# Patient Record
Sex: Female | Born: 1965
Health system: Southern US, Community
[De-identification: ages and names within clinical notes are randomized; demographics above are authoritative.]

## PROBLEM LIST (undated history)

## (undated) DIAGNOSIS — K2270 Barrett's esophagus without dysplasia: Secondary | ICD-10-CM

## (undated) DIAGNOSIS — A048 Other specified bacterial intestinal infections: Secondary | ICD-10-CM

## (undated) DIAGNOSIS — I10 Essential (primary) hypertension: Secondary | ICD-10-CM

## (undated) DIAGNOSIS — K219 Gastro-esophageal reflux disease without esophagitis: Secondary | ICD-10-CM

## (undated) DIAGNOSIS — M542 Cervicalgia: Secondary | ICD-10-CM

## (undated) DIAGNOSIS — R011 Cardiac murmur, unspecified: Secondary | ICD-10-CM

## (undated) HISTORY — PX: ECTOPIC PREGNANCY SURGERY: SHX613

## (undated) HISTORY — PX: BREAST BIOPSY: SHX20

## (undated) HISTORY — DX: Cardiac murmur, unspecified: R01.1

## (undated) HISTORY — DX: Cervicalgia: M54.2

## (undated) HISTORY — PX: CHOLECYSTECTOMY: SHX55

## (undated) HISTORY — DX: Gastro-esophageal reflux disease without esophagitis: K21.9

## (undated) HISTORY — DX: Other specified bacterial intestinal infections: A04.8

---

## 2008-01-05 ENCOUNTER — Encounter: Payer: Self-pay | Admitting: Family Medicine

## 2013-05-18 LAB — HM COLONOSCOPY

## 2016-05-29 ENCOUNTER — Ambulatory Visit (INDEPENDENT_AMBULATORY_CARE_PROVIDER_SITE_OTHER): Payer: Self-pay | Admitting: Primary Care

## 2016-05-29 DIAGNOSIS — Z0289 Encounter for other administrative examinations: Secondary | ICD-10-CM

## 2016-06-12 ENCOUNTER — Encounter (HOSPITAL_COMMUNITY): Payer: Self-pay

## 2016-06-12 ENCOUNTER — Ambulatory Visit (HOSPITAL_COMMUNITY)
Admission: EM | Admit: 2016-06-12 | Discharge: 2016-06-12 | Disposition: A | Discharge status: Home / Self Care | Payer: Commercial Managed Care - PPO | Attending: Family Medicine | Admitting: Family Medicine

## 2016-06-12 DIAGNOSIS — R110 Nausea: Secondary | ICD-10-CM | POA: Diagnosis not present

## 2016-06-12 DIAGNOSIS — Z9049 Acquired absence of other specified parts of digestive tract: Secondary | ICD-10-CM | POA: Insufficient documentation

## 2016-06-12 DIAGNOSIS — Z888 Allergy status to other drugs, medicaments and biological substances status: Secondary | ICD-10-CM | POA: Diagnosis not present

## 2016-06-12 DIAGNOSIS — H10021 Other mucopurulent conjunctivitis, right eye: Secondary | ICD-10-CM | POA: Insufficient documentation

## 2016-06-12 DIAGNOSIS — F17210 Nicotine dependence, cigarettes, uncomplicated: Secondary | ICD-10-CM | POA: Diagnosis not present

## 2016-06-12 DIAGNOSIS — N3000 Acute cystitis without hematuria: Secondary | ICD-10-CM | POA: Insufficient documentation

## 2016-06-12 DIAGNOSIS — I10 Essential (primary) hypertension: Secondary | ICD-10-CM | POA: Diagnosis not present

## 2016-06-12 DIAGNOSIS — Z79899 Other long term (current) drug therapy: Secondary | ICD-10-CM | POA: Diagnosis not present

## 2016-06-12 HISTORY — DX: Essential (primary) hypertension: I10

## 2016-06-12 HISTORY — DX: Barrett's esophagus without dysplasia: K22.70

## 2016-06-12 LAB — POCT URINALYSIS DIP (DEVICE)
BILIRUBIN URINE: NEGATIVE
Glucose, UA: NEGATIVE mg/dL
KETONES UR: NEGATIVE mg/dL
NITRITE: POSITIVE — AB
Protein, ur: 100 mg/dL — AB
SPECIFIC GRAVITY, URINE: 1.025 (ref 1.005–1.030)
Urobilinogen, UA: 0.2 mg/dL (ref 0.0–1.0)
pH: 5.5 (ref 5.0–8.0)

## 2016-06-12 MED ORDER — TOBRAMYCIN 0.3 % OP SOLN: 1.0000 [drp] | OPHTHALMIC | 0 refills | Status: DC

## 2016-06-12 MED ORDER — CEPHALEXIN 500 MG PO CAPS: 500.0000 mg | ORAL_CAPSULE | Freq: Three times a day (TID) | ORAL | 0 refills | Status: DC

## 2016-06-12 NOTE — ED Triage Notes (Signed)
Pt here for pink eye in the right eye which has going on for 2 days. Using visine. UTI for 3 weeks was called in a rx from her pcp out of town. Finished it and 2 days later it came back. Having pressure in her vagina and cramping. No fever.

## 2016-06-12 NOTE — ED Provider Notes (Signed)
[facility]    CSN: [id number] Arrival date & time: 06/12/16  1237     History   Chief Complaint Chief Complaint  Patient presents with  . Conjunctivitis  . Urinary Tract Infection    HPI Christina Bell is a 51 y.o. female.   This is a 51 year old woman who just moved down to [place] from [place]. Her husband is working at [employer]. Her graft patient had 2 days of right eye injection with purulent discharge and itchiness. Eyes turned red as well.  She's also has dysuria and frequency. She was treated for a UTI 2 weeks ago by her doctor in [place] and his symptoms seemed to resolve. She remembers the antibiotic by the name of Cinobac. Her symptoms began returning several days ago, 2 days after she finished the antibiotic. She's had some mild nausea but no vomiting. She has no flank pain or fever.      Past Medical History:  Diagnosis Date  . Barrett esophagus   . Hypertension     There are no active problems to display for this patient.   Past Surgical History:  Procedure Laterality Date  . CHOLECYSTECTOMY      OB History    No data available       Home Medications    Prior to Admission medications   Medication Sig Start Date End Date Taking? Authorizing Provider  amLODipine (NORVASC) 10 MG tablet Take 10 mg by mouth daily.   Yes Historical Provider, MD  metoprolol succinate (TOPROL-XL) 50 MG 24 hr tablet Take 50 mg by mouth daily. Take with or immediately following a meal.   Yes Historical Provider, MD  omeprazole (PRILOSEC) 20 MG capsule Take 20 mg by mouth daily.   Yes Historical Provider, MD  cephALEXin (KEFLEX) 500 MG capsule Take 1 capsule (500 mg total) by mouth 3 (three) times daily. 06/12/16   Elvina SidleKurt Tahjanae Blankenburg, MD  tobramycin (TOBREX) 0.3 % ophthalmic solution Place 1 drop into the right eye every 4 (four) hours. 06/12/16   Elvina SidleKurt Maicee Ullman, MD    Family History No family history on file.  Social History Social History  Substance  Use Topics  . Smoking status: Current Every Day Smoker    Packs/day: 0.50    Years: 25.00    Types: Cigarettes  . Smokeless tobacco: Never Used  . Alcohol use 1.2 oz/week    2 Standard drinks or equivalent per week     Allergies   Morphine and related   Review of Systems Review of Systems  Constitutional: Negative.   HENT: Negative.   Eyes: Positive for discharge, redness and itching.  Genitourinary: Positive for dysuria and frequency. Negative for flank pain.     Physical Exam Triage Vital Signs ED Triage Vitals [06/12/16 1330]  Enc Vitals Group     BP (!) 159/101     Pulse Rate 119     Resp 20     Temp 98.8 F (37.1 C)     Temp src      SpO2 97 %     Weight      Height      Head Circumference      Peak Flow      Pain Score      Pain Loc      Pain Edu?      Excl. in GC?    No data found.   Updated Vital Signs BP (!) 159/101 (BP Location: Left Arm)   Pulse 119  Temp 98.8 F (37.1 C)   Resp 20   SpO2 97%    Physical Exam  Constitutional: She is oriented to person, place, and time. She appears well-developed and well-nourished.  HENT:  Head: Normocephalic.  Right Ear: External ear normal.  Left Ear: External ear normal.  Mouth/Throat: Oropharynx is clear and moist.  Eyes: Right eye exhibits discharge.  Injected conjunctiva, right  Normal funduscopic exam  Neck: Normal range of motion. Neck supple.  Pulmonary/Chest: Effort normal.  Musculoskeletal: Normal range of motion.  Neurological: She is alert and oriented to person, place, and time.  Skin: Skin is warm and dry.  Nursing note and vitals reviewed.    UC Treatments / Results  Labs (all labs ordered are listed, but only abnormal results are displayed) Labs Reviewed  POCT URINALYSIS DIP (DEVICE) - Abnormal; Notable for the following:       Result Value   Hgb urine dipstick MODERATE (*)    Protein, ur 100 (*)    Nitrite POSITIVE (*)    Leukocytes, UA MODERATE (*)    All other  components within normal limits  URINE CULTURE    EKG  EKG Interpretation None       Radiology No results found.  Procedures Procedures (including critical care time)  Medications Ordered in UC Medications - No data to display   Initial Impression / Assessment and Plan / UC Course  I have reviewed the triage vital signs and the nursing notes.  Pertinent labs & imaging results that were available during my care of the patient were reviewed by me and considered in my medical decision making (see chart for details).     Final Clinical Impressions(s) / UC Diagnoses   Final diagnoses:  Acute cystitis without hematuria  Other mucopurulent conjunctivitis of right eye    New Prescriptions New Prescriptions   CEPHALEXIN (KEFLEX) 500 MG CAPSULE    Take 1 capsule (500 mg total) by mouth 3 (three) times daily.   TOBRAMYCIN (TOBREX) 0.3 % OPHTHALMIC SOLUTION    Place 1 drop into the right eye every 4 (four) hours.     Elvina Sidle, MD 06/12/16 1409

## 2016-06-14 LAB — URINE CULTURE: Culture: >=100000 — AB

## 2016-06-30 ENCOUNTER — Ambulatory Visit (INDEPENDENT_AMBULATORY_CARE_PROVIDER_SITE_OTHER): Payer: Commercial Managed Care - PPO | Admitting: Primary Care

## 2016-06-30 ENCOUNTER — Encounter: Payer: Self-pay | Admitting: Primary Care

## 2016-06-30 VITALS — BP 118/86 | HR 89 | Temp 98.1°F | Ht 62.5 in | Wt 167.0 lb

## 2016-06-30 DIAGNOSIS — I10 Essential (primary) hypertension: Secondary | ICD-10-CM | POA: Diagnosis not present

## 2016-06-30 DIAGNOSIS — K219 Gastro-esophageal reflux disease without esophagitis: Secondary | ICD-10-CM | POA: Diagnosis not present

## 2016-06-30 DIAGNOSIS — R300 Dysuria: Secondary | ICD-10-CM | POA: Diagnosis not present

## 2016-06-30 LAB — POC URINALSYSI DIPSTICK (AUTOMATED)
BILIRUBIN UA: NEGATIVE
GLUCOSE UA: NEGATIVE
Nitrite, UA: NEGATIVE
PH UA: 6
Protein, UA: 15
RBC UA: NEGATIVE
Urobilinogen, UA: 0.2

## 2016-06-30 MED ORDER — SULFAMETHOXAZOLE-TRIMETHOPRIM 800-160 MG PO TABS: 1.0000 | ORAL_TABLET | Freq: Two times a day (BID) | ORAL | 0 refills | Status: DC

## 2016-06-30 MED ORDER — AMLODIPINE BESYLATE 10 MG PO TABS: 10.0000 mg | ORAL_TABLET | Freq: Every day | ORAL | 3 refills | Status: DC

## 2016-06-30 MED ORDER — OMEPRAZOLE 20 MG PO CPDR: 20.0000 mg | DELAYED_RELEASE_CAPSULE | Freq: Every day | ORAL | 3 refills | Status: DC

## 2016-06-30 MED ORDER — CIPROFLOXACIN HCL 500 MG PO TABS: 500.0000 mg | ORAL_TABLET | Freq: Two times a day (BID) | ORAL | 0 refills | Status: DC

## 2016-06-30 MED ORDER — METOPROLOL SUCCINATE ER 50 MG PO TB24: 50.0000 mg | ORAL_TABLET | Freq: Every day | ORAL | 3 refills | Status: DC

## 2016-06-30 NOTE — Assessment & Plan Note (Signed)
Stable on current regimen. Refills sent to pharmacy. Will obtain records for recent BMP.

## 2016-06-30 NOTE — Progress Notes (Signed)
Pre visit review using our clinic review tool, if applicable. No additional management support is needed unless otherwise documented below in the visit note. 

## 2016-06-30 NOTE — Assessment & Plan Note (Signed)
History of Barrett's esophagus, stable on omeprazole 20 mg. Refills sent to pharmacy.

## 2016-06-30 NOTE — Progress Notes (Signed)
Subjective:    Patient ID: Christina Bell, female    DOB: 1965/08/14, 51 y.o.   MRN: [id number]  HPI  Christina Bell is a 51 year old female who presents today to establish care and discuss the problems mentioned below. Will obtain old records.  1) Essential Hypertension: Diagnosed several years ago. Currently managed on amlodipine 10 mg and metoprolol succinate 50 mg. She does not check her BP at home. She denies visual changes, dizziness, headaches, chest pain.  2) UTI: No prior history of UTI's. Evaluated and treated at [facility] on 06/12/16 with complaints of dysuria and frequency. She was treated for UTI two weeks prior by PCP in [place]. She was not seen by PCP, antibiotics were sent in without exam. UA on 06/12/16 with leuks, nitrites, blood. She was prescribed cephalexin and sent home. Her urine culture came back positive for E.Coli which was sensitive to cephalexin.   Since treatment she's noticed frequency with dribbling, pelvic cramping, and left low back pain. Overall her cramping has improved. She completed her cephalexin as prescribed. She denies hematuria, fevers, vaginal discharge, vaginal itching.   3) Depression: She has symptoms of no energy, feeling tired, feeling stressed, is grieving the loss of her father, no motivation to do anything. She's been feeling this way for the past 2 months. PHQ 9 score of 12 and GAD 7 score of 8 today. She's recently moved from [place]. Denies SI/HI.  Review of Systems  Constitutional: Positive for fatigue.  Eyes: Negative for visual disturbance.  Respiratory: Negative for shortness of breath.   Cardiovascular: Negative for chest pain.  Genitourinary: Positive for dysuria and frequency.  Musculoskeletal: Positive for back pain.  Neurological: Negative for headaches.  Psychiatric/Behavioral: Positive for sleep disturbance. The patient is nervous/anxious.        Depression symptoms       Past Medical History:  Diagnosis Date  . Barrett  esophagus   . GERD (gastroesophageal reflux disease)   . Heart murmur   . Hypertension      Social History   Social History  . Marital status: Married    Spouse name: N/A  . Number of children: N/A  . Years of education: N/A   Occupational History  . Not on file.   Social History Main Topics  . Smoking status: Current Every Day Smoker    Packs/day: 0.50    Years: 25.00    Types: Cigarettes  . Smokeless tobacco: Never Used  . Alcohol use 1.2 oz/week    2 Standard drinks or equivalent per week  . Drug use: Unknown  . Sexual activity: Not on file   Other Topics Concern  . Not on file   Social History Narrative   Married.   3 children.   Worked as a Runner, broadcasting/film/video.    Enjoys waking, bicycling, driving.    Past Surgical History:  Procedure Laterality Date  . CESAREAN SECTION    . CHOLECYSTECTOMY    . ECTOPIC PREGNANCY SURGERY      Family History  Problem Relation Age of Onset  . Hypertension Mother   . Lung cancer Maternal Grandmother     Allergies  Allergen Reactions  . Morphine And Related Hives    No current outpatient prescriptions on file prior to visit.   No current facility-administered medications on file prior to visit.     BP 118/86 (BP Location: Left Arm, Patient Position: Sitting, Cuff Size: Normal)   Pulse 89   Temp 98.1 F (36.7 C) (Oral)  Ht 5' 2.5" (1.588 m)   Wt 167 lb (75.8 kg)   SpO2 97%   BMI 30.06 kg/m    Objective:   Physical Exam  Constitutional: She appears well-nourished.  Neck: Neck supple.  Cardiovascular: Normal rate and regular rhythm.   Pulmonary/Chest: Effort normal and breath sounds normal.  Abdominal: Soft. Bowel sounds are normal. There is no tenderness. There is no CVA tenderness.  Skin: Skin is warm and dry.  Psychiatric: She has a normal mood and affect.          Assessment & Plan:  Dysuria/Urgency:  Diagnosed with UTI on 06/12/16, treated with Cephalexin. UA today: 3+ leuks, no nitrites, no  blood. Culture sent. Given symptoms, UA result, and presentation, will treat. Rx for Bactrim course sent to pharmacy. Discussed to push intake of water.  Follow up PRN.  Morrie Sheldonlark,Asencion Loveday Kendal, NP

## 2016-06-30 NOTE — Addendum Note (Signed)
Addended by: Eual FinesBRIDGES, SHANNON P on: 06/30/2016 02:50 PM   Modules accepted: Orders

## 2016-06-30 NOTE — Patient Instructions (Addendum)
I sent refills of your medications to the pharmacy.  Depression/Anxiety: Start exercising. You should be getting 150 minutes of moderate intensity exercise weekly.  Eat a balanced diet with fresh fruits, vegetables, whole grains.  Ensure you are consuming 64 ounces of water daily.  Please message me through My Chart if you're interested in seeing our therapist.   Urinary Tract Infection: Start Bactrim antibiotics. Take 1 tablet by mouth twice daily for 7 days.  Please schedule a physical with me in 2018. You may also schedule a lab only appointment 3-4 days prior. We will discuss your lab results in detail during your physical.  It was a pleasure to meet you today! Please don't hesitate to call me with any questions. Welcome to [facility]!

## 2016-07-02 LAB — URINE CULTURE

## 2016-08-21 ENCOUNTER — Telehealth: Payer: Self-pay | Admitting: Primary Care

## 2016-08-21 NOTE — Telephone Encounter (Signed)
Left message for pt to call re: 05/29/16 No show fee.  Pt reschedueld on 06/19/16, but cannot see where called to cancel.  Pt to call back  / lt

## 2016-08-28 ENCOUNTER — Telehealth: Payer: Self-pay | Admitting: Primary Care

## 2016-08-28 ENCOUNTER — Encounter: Payer: Self-pay | Admitting: Primary Care

## 2016-08-28 ENCOUNTER — Encounter (INDEPENDENT_AMBULATORY_CARE_PROVIDER_SITE_OTHER): Payer: Self-pay

## 2016-08-28 ENCOUNTER — Ambulatory Visit (INDEPENDENT_AMBULATORY_CARE_PROVIDER_SITE_OTHER): Payer: Commercial Managed Care - PPO | Admitting: Primary Care

## 2016-08-28 VITALS — BP 136/84 | HR 86 | Temp 98.0°F | Ht 62.5 in | Wt 170.8 lb

## 2016-08-28 DIAGNOSIS — H1011 Acute atopic conjunctivitis, right eye: Secondary | ICD-10-CM | POA: Diagnosis not present

## 2016-08-28 DIAGNOSIS — K219 Gastro-esophageal reflux disease without esophagitis: Secondary | ICD-10-CM | POA: Diagnosis not present

## 2016-08-28 DIAGNOSIS — H1010 Acute atopic conjunctivitis, unspecified eye: Secondary | ICD-10-CM | POA: Insufficient documentation

## 2016-08-28 DIAGNOSIS — N390 Urinary tract infection, site not specified: Secondary | ICD-10-CM

## 2016-08-28 DIAGNOSIS — K030 Excessive attrition of teeth: Secondary | ICD-10-CM

## 2016-08-28 DIAGNOSIS — K032 Erosion of teeth: Secondary | ICD-10-CM

## 2016-08-28 LAB — POC URINALSYSI DIPSTICK (AUTOMATED)
Bilirubin, UA: NEGATIVE
Glucose, UA: NEGATIVE
Ketones, UA: NEGATIVE
Leukocytes, UA: NEGATIVE
NITRITE UA: NEGATIVE
PROTEIN UA: NEGATIVE
RBC UA: NEGATIVE
UROBILINOGEN UA: NEGATIVE U/dL — AB
pH, UA: 6 (ref 5.0–8.0)

## 2016-08-28 MED ORDER — OLOPATADINE HCL 0.1 % OP SOLN: 1.0000 [drp] | Freq: Two times a day (BID) | OPHTHALMIC | 1 refills | Status: DC

## 2016-08-28 NOTE — Assessment & Plan Note (Signed)
Chronic since early January 2018, no improvement with antibiotic drops or OTC drops. Both eyes appear injected, mostly right.  Rx for Patanol gtts sent to pharmacy.  She will update. Consider OTC antihistamine.

## 2016-08-28 NOTE — Assessment & Plan Note (Signed)
Three infections since early January 2018. UA today with trace protein otherwise unremarkable. Bed wetting is suspicious for Uro/GYN consult, also recurrent UTI's. Culture sent today. Referral placed for Urology evaluation.

## 2016-08-28 NOTE — Progress Notes (Signed)
Subjective:    Patient ID: Christina Bell, female    DOB: [birth year], 51 y.o.   MRN: [id number]  HPI  Christina Bell is a 51 year old female who presents today with multiple complaints.  1) Urinary Frequency: Intermittent since January 2018. She was treated remotely for UTI by her prior PCP in [place] in early January with antibiotics. She then presented to urgent care in late January 2018 with complaints of return in symptoms just after completing the antibiotics. During her visit in the Urgent Care she was treated with Keflex course. Positive culture that was sensitive to Keflex.  She then presented to our office in mid February with complaints of UTI symptoms. Her UA was noted to have 3+ leuks, no nitirites, no blood. She was treated with Bactrim DS tablets. Her culture returned positive for E. Coli which was sensitive to Bactrim.   Her frequency has been present this time intermittently for the past three weeks. She also reports pelvic pain. She denies vaginal discharge, vaginal itching, vaginal bleeding. She's also noticed urinating in her bed for the past three nights. This did wake her from sleep. She had a history of uterine ablation.   2) Eye Irritation: Located to the right eye that has been intermittent since January 2018. She originally presented to urgent care in late January 2018 with complaints of purulent discharge and itchiness. During her visit in the Urgent Care she was treated with Tobramycin drops.    She never felt better after treatment with tobramycin. She's experiencing symptoms of itchy/watery eyes, with clear drainage every morning. Her symptoms are daily. She's been using Vysine drops and Tobramycin without improvement.    3) Dental Implants: History of Barrett's Esophagus and GERD for years. Recently underwent dental surgery which included complete dental implants due to erosion to the enamel on most of her teeth. She's having trouble getting dental insurance coverage  for this massive procedure. She is needing a referral to her dentist as this was caused by chronic GERD secondary to Barrett's esophagus.   Review of Systems  Constitutional: Negative for fever.  HENT: Positive for rhinorrhea. Negative for congestion.   Eyes: Positive for discharge, redness and itching.  Respiratory: Negative for cough.   Genitourinary: Positive for frequency and urgency. Negative for dysuria, flank pain, hematuria and vaginal discharge.       Pelvic pressure       Past Medical History:  Diagnosis Date  . Barrett esophagus   . GERD (gastroesophageal reflux disease)   . Heart murmur   . Hypertension      Social History   Social History  . Marital status: Married    Spouse name: N/A  . Number of children: N/A  . Years of education: N/A   Occupational History  . Not on file.   Social History Main Topics  . Smoking status: Current Every Day Smoker    Packs/day: 0.50    Years: 25.00    Types: Cigarettes  . Smokeless tobacco: Never Used  . Alcohol use 1.2 oz/week    2 Standard drinks or equivalent per week  . Drug use: Unknown  . Sexual activity: Not on file   Other Topics Concern  . Not on file   Social History Narrative   Married.   3 children.   Worked as a Runner, broadcasting/film/video.    Enjoys waking, bicycling, driving.    Past Surgical History:  Procedure Laterality Date  . CESAREAN SECTION    . CHOLECYSTECTOMY    .  ECTOPIC PREGNANCY SURGERY      Family History  Problem Relation Age of Onset  . Hypertension Mother   . Lung cancer Maternal Grandmother     Allergies  Allergen Reactions  . Morphine And Related Hives    Current Outpatient Prescriptions on File Prior to Visit  Medication Sig Dispense Refill  . amLODipine (NORVASC) 10 MG tablet Take 1 tablet (10 mg total) by mouth daily. 90 tablet 3  . metoprolol succinate (TOPROL-XL) 50 MG 24 hr tablet Take 1 tablet (50 mg total) by mouth daily. Take with or immediately following a meal. 90 tablet 3     No current facility-administered medications on file prior to visit.     BP 136/84   Pulse 86   Temp 98 F (36.7 C) (Oral)   Ht 5' 2.5" (1.588 m)   Wt 170 lb 12.8 oz (77.5 kg)   SpO2 98%   BMI 30.74 kg/m    Objective:   Physical Exam  Constitutional: She appears well-nourished.  Eyes: Right eye exhibits no discharge. No foreign body present in the right eye. Left eye exhibits no discharge. No foreign body present in the left eye. Right conjunctiva is injected. Right conjunctiva has no hemorrhage. Left conjunctiva is injected. Left conjunctiva has no hemorrhage.  Neck: Neck supple.  Cardiovascular: Normal rate and regular rhythm.   Pulmonary/Chest: Effort normal and breath sounds normal.  Abdominal: Soft. Normal appearance and bowel sounds are normal. There is no tenderness.  Skin: Skin is warm and dry.          Assessment & Plan:

## 2016-08-28 NOTE — Telephone Encounter (Signed)
Placed letter for Jae Dire to review in her inbox.

## 2016-08-28 NOTE — Patient Instructions (Addendum)
Start olopatadine drops for allergy eye irritation. Instill 1 drop into both eyes twice daily x 1 week or until symptoms resolve.  Please notify me if no improvement.  Stop by the front desk and speak with either Shirlee Limerick or Zella Ball regarding your referral to Urology.  Please provide me with a copy of the letter from your dentist.  It was a pleasure to see you today!

## 2016-08-28 NOTE — Assessment & Plan Note (Signed)
Will look for letter from Dentist who completed implants. Do believe erosion of enamel secondary to GERD from Barrett's Esophagus. Will place referral for insurance purposes once letter read from dentist.

## 2016-08-28 NOTE — Telephone Encounter (Signed)
Paper was dropped off regarding patient. Left in RX tower.

## 2016-08-28 NOTE — Progress Notes (Signed)
Pre visit review using our clinic review tool, if applicable. No additional management support is needed unless otherwise documented below in the visit note. 

## 2016-08-28 NOTE — Telephone Encounter (Signed)
Noted, letter received, referral placed. Agree that the dental erosion and severe attrition were secondary to GERD from Barrett's esophagus. Letter scanned into patient's chart.

## 2016-08-30 LAB — URINE CULTURE

## 2016-09-03 ENCOUNTER — Encounter: Payer: Self-pay | Admitting: Primary Care

## 2016-10-18 ENCOUNTER — Other Ambulatory Visit: Payer: Self-pay | Admitting: Primary Care

## 2016-10-18 DIAGNOSIS — H1011 Acute atopic conjunctivitis, right eye: Secondary | ICD-10-CM

## 2016-10-19 NOTE — Telephone Encounter (Signed)
Ok to refill? Electronically refill request for olopatadine (PATANOL) 0.1 % ophthalmic solution.  Last prescribed and seen on 08/28/2016

## 2016-10-19 NOTE — Telephone Encounter (Signed)
Are the olopatadine eye drops helping with her eye symptoms? Does she need a refill or was this an automatic request?

## 2016-10-20 NOTE — Telephone Encounter (Signed)
Spoke to pt. She said she is taking Claritin and it seems to be helping better than the drops. She did not ask for a refill.

## 2016-10-20 NOTE — Telephone Encounter (Signed)
Left detailed message on vm per dpr to cal the office to let us know if she really needed the refill.

## 2016-11-30 ENCOUNTER — Telehealth: Payer: Self-pay | Admitting: Primary Care

## 2016-11-30 NOTE — Telephone Encounter (Signed)
Pt has cpe next Monday, she is scheduled for labs this Thursday 12/03/16.  No labs are in yet, can you please add labs? Thanks

## 2016-12-01 ENCOUNTER — Other Ambulatory Visit: Payer: Self-pay | Admitting: Primary Care

## 2016-12-01 DIAGNOSIS — I10 Essential (primary) hypertension: Secondary | ICD-10-CM

## 2016-12-03 ENCOUNTER — Other Ambulatory Visit (INDEPENDENT_AMBULATORY_CARE_PROVIDER_SITE_OTHER): Payer: Commercial Managed Care - PPO

## 2016-12-03 DIAGNOSIS — I10 Essential (primary) hypertension: Secondary | ICD-10-CM

## 2016-12-03 LAB — LIPID PANEL
CHOLESTEROL: 234 mg/dL — AB (ref 0–200)
HDL: 89.9 mg/dL (ref >39.00)
LDL CALC: 127 mg/dL — AB (ref 0–99)
NONHDL: 144.29
Total CHOL/HDL Ratio: 3
Triglycerides: 88 mg/dL (ref 0.0–149.0)
VLDL: 17.6 mg/dL (ref 0.0–40.0)

## 2016-12-03 LAB — COMPREHENSIVE METABOLIC PANEL
ALBUMIN: 4.4 g/dL (ref 3.5–5.2)
ALK PHOS: 81 U/L (ref 39–117)
ALT: 42 U/L — ABNORMAL HIGH (ref 0–35)
AST: 60 U/L — AB (ref 0–37)
BUN: 10 mg/dL (ref 6–23)
CO2: 26 mEq/L (ref 19–32)
CREATININE: 0.67 mg/dL (ref 0.40–1.20)
Calcium: 10 mg/dL (ref 8.4–10.5)
Chloride: 104 mEq/L (ref 96–112)
GFR: 98.17 mL/min (ref >60.00)
GLUCOSE: 90 mg/dL (ref 70–99)
POTASSIUM: 4 meq/L (ref 3.5–5.1)
SODIUM: 142 meq/L (ref 135–145)
TOTAL PROTEIN: 7.3 g/dL (ref 6.0–8.3)
Total Bilirubin: 0.7 mg/dL (ref 0.2–1.2)

## 2016-12-07 ENCOUNTER — Ambulatory Visit (INDEPENDENT_AMBULATORY_CARE_PROVIDER_SITE_OTHER): Payer: Commercial Managed Care - PPO | Admitting: Primary Care

## 2016-12-07 ENCOUNTER — Encounter: Payer: Self-pay | Admitting: Primary Care

## 2016-12-07 ENCOUNTER — Other Ambulatory Visit (HOSPITAL_COMMUNITY)
Admission: RE | Admit: 2016-12-07 | Discharge: 2016-12-07 | Disposition: A | Discharge status: Home / Self Care | Payer: Commercial Managed Care - PPO | Source: Ambulatory Visit | Attending: Primary Care | Admitting: Primary Care

## 2016-12-07 VITALS — BP 130/84 | HR 87 | Temp 98.1°F | Ht 62.5 in | Wt 153.0 lb

## 2016-12-07 DIAGNOSIS — E785 Hyperlipidemia, unspecified: Secondary | ICD-10-CM | POA: Insufficient documentation

## 2016-12-07 DIAGNOSIS — R7989 Other specified abnormal findings of blood chemistry: Secondary | ICD-10-CM | POA: Diagnosis present

## 2016-12-07 DIAGNOSIS — I10 Essential (primary) hypertension: Secondary | ICD-10-CM

## 2016-12-07 DIAGNOSIS — Z124 Encounter for screening for malignant neoplasm of cervix: Secondary | ICD-10-CM

## 2016-12-07 DIAGNOSIS — K219 Gastro-esophageal reflux disease without esophagitis: Secondary | ICD-10-CM | POA: Diagnosis not present

## 2016-12-07 DIAGNOSIS — Z0000 Encounter for general adult medical examination without abnormal findings: Secondary | ICD-10-CM

## 2016-12-07 DIAGNOSIS — Z1239 Encounter for other screening for malignant neoplasm of breast: Secondary | ICD-10-CM

## 2016-12-07 DIAGNOSIS — Z1231 Encounter for screening mammogram for malignant neoplasm of breast: Secondary | ICD-10-CM

## 2016-12-07 DIAGNOSIS — R945 Abnormal results of liver function studies: Secondary | ICD-10-CM

## 2016-12-07 MED ORDER — OMEPRAZOLE 40 MG PO CPDR: 40.0000 mg | DELAYED_RELEASE_CAPSULE | Freq: Every day | ORAL | 3 refills | Status: DC

## 2016-12-07 NOTE — Assessment & Plan Note (Signed)
TC slightly above goal, LDL borderline. Discussed to start exercising and increase vegetables, fruit, whole grains. Recommended to stop smoking. Will repeat lipids in 3 months.

## 2016-12-07 NOTE — Patient Instructions (Addendum)
Call the [facility] to schedule your mammogram.  We will notify you of your Pap results once received.   Reduce fatty/fried foods to help reduce cholesterol. increase vegetables, fruit, whole grains, water.  Ensure you are consuming 64 ounces of water daily.  Start exercising. You should be getting 150 minutes of moderate intensity exercise weekly.  Schedule a lab only appointment in 3 months to recheck your liver enzymes and cholesterol.  Follow up in 1 year for your annual exam or sooner if needed.  It was a pleasure to see you today!   High Cholesterol High cholesterol is a condition in which the blood has high levels of a white, waxy, fat-like substance (cholesterol). The human body needs small amounts of cholesterol. The liver makes all the cholesterol that the body needs. Extra (excess) cholesterol comes from the food that we eat. Cholesterol is carried from the liver by the blood through the blood vessels. If you have high cholesterol, deposits (plaques) may build up on the walls of your blood vessels (arteries). Plaques make the arteries narrower and stiffer. Cholesterol plaques increase your risk for heart attack and stroke. Work with your health care provider to keep your cholesterol levels in a healthy range. What increases the risk? This condition is more likely to develop in people who:  Eat foods that are high in animal fat (saturated fat) or cholesterol.  Are overweight.  Are not getting enough exercise.  Have a family history of high cholesterol.  What are the signs or symptoms? There are no symptoms of this condition. How is this diagnosed? This condition may be diagnosed from the results of a blood test.  If you are older than age 66, your health care provider may check your cholesterol every 4-6 years.  You may be checked more often if you already have high cholesterol or other risk factors for heart disease.  The blood test for cholesterol  measures:  "Bad" cholesterol (LDL cholesterol). This is the main type of cholesterol that causes heart disease. The desired level for LDL is less than 100.  "Good" cholesterol (HDL cholesterol). This type helps to protect against heart disease by cleaning the arteries and carrying the LDL away. The desired level for HDL is 60 or higher.  Triglycerides. These are fats that the body can store or burn for energy. The desired number for triglycerides is lower than 150.  Total cholesterol. This is a measure of the total amount of cholesterol in your blood, including LDL cholesterol, HDL cholesterol, and triglycerides. A healthy number is less than 200.  How is this treated? This condition is treated with diet changes, lifestyle changes, and medicines. Diet changes  This may include eating more whole grains, fruits, vegetables, nuts, and fish.  This may also include cutting back on red meat and foods that have a lot of added sugar. Lifestyle changes  Changes may include getting at least 40 minutes of aerobic exercise 3 times a week. Aerobic exercises include walking, biking, and swimming. Aerobic exercise along with a healthy diet can help you maintain a healthy weight.  Changes may also include quitting smoking. Medicines  Medicines are usually given if diet and lifestyle changes have failed to reduce your cholesterol to healthy levels.  Your health care provider may prescribe a statin medicine. Statin medicines have been shown to reduce cholesterol, which can reduce the risk of heart disease. Follow these instructions at home: Eating and drinking  If told by your health care provider:  Eat chicken (without skin), fish, veal, shellfish, ground Malaw[place]urkey breast, and round or loin cuts of red meat.  Do not eat fried foods or fatty meats, such as hot dogs and salami.  Eat plenty of fruits, such as apples.  Eat plenty of vegetables, such as broccoli, potatoes, and carrots.  Eat beans,  peas, and lentils.  Eat grains such as barley, rice, couscous, and bulgur wheat.  Eat pasta without cream sauces.  Use skim or nonfat milk, and eat low-fat or nonfat yogurt and cheeses.  Do not eat or drink whole milk, cream, ice cream, egg yolks, or hard cheeses.  Do not eat stick margarine or tub margarines that contain trans fats (also called partially hydrogenated oils).  Do not eat saturated tropical oils, such as coconut oil and palm oil.  Do not eat cakes, cookies, crackers, or other baked goods that contain trans fats.  General instructions  Exercise as directed by your health care provider. Increase your activity level with activities such as gardening, walking, and taking the stairs.  Take over-the-counter and prescription medicines only as told by your health care provider.  Do not use any products that contain nicotine or tobacco, such as cigarettes and e-cigarettes. If you need help quitting, ask your health care provider.  Keep all follow-up visits as told by your health care provider. This is important. Contact a health care provider if:  You are struggling to maintain a healthy diet or weight.  You need help to start on an exercise program.  You need help to stop smoking. Get help right away if:  You have chest pain.  You have trouble breathing. This information is not intended to replace advice given to you by your health care provider. Make sure you discuss any questions you have with your health care provider. Document Released: 05/04/2005 Document Revised: 11/30/2015 Document Reviewed: 11/02/2015 Elsevier Interactive Patient Education  2017 [employer]

## 2016-12-07 NOTE — Assessment & Plan Note (Signed)
Stable in the office today, continue amlodipine 10 mg and metoprolol succinate 50 mg. BMP unremarkable.

## 2016-12-07 NOTE — Assessment & Plan Note (Signed)
Well controlled on omeprazole 40 mg. Recently completed dental work due to reflux erosion to her oral cavity.

## 2016-12-07 NOTE — Assessment & Plan Note (Signed)
Slightly above goal today. Recommended she reduce alcohol consumption, fatty foods, start exercising. Recheck in 3 months.

## 2016-12-07 NOTE — Assessment & Plan Note (Signed)
TD UTD per patient, she will check records. Pap due, completed and pending. Mammogram due, ordered. Recommended regular exercise and improvement in diet. Exam unremarkable. Labs with hyperlipidemia and slight elevation in LFT's.  Follow up in 1 year.

## 2016-12-07 NOTE — Addendum Note (Signed)
Addended by: Tawnya CrookSAMBATH, Hideko Esselman on: 12/07/2016 03:26 PM   Modules accepted: Orders

## 2016-12-07 NOTE — Progress Notes (Signed)
Subjective:    Patient ID: Christina Bell, female    DOB: [birth year], 51 y.o.   MRN: [id number]  HPI  Christina Bell is a 51 year old female who presents today for complete physical.  Immunizations: -Tetanus: Unsure, believes it's been within 10 years. Will check her records. -Influenza: Did not complete last season.   Diet: She endorses a diet consisting of soft foods given recent dental implants. Breakfast: Scrambled egg Lunch: Salad, chicken, burger without the bun Dinner: Steak, salad Snacks: None Desserts: None Beverages: Water with lemon, un-sweet tea. 3 capfuls   Exercise: She is not currently exercising.  Eye exam: Completed several years ago, wearing reading glasses Dental exam: Up to date Colonoscopy: Completed in 2015, due in 2025 Pap Smear: Completed 6 years ago. Due Mammogram: Completed 23 years ago.    Review of Systems  Constitutional: Negative for unexpected weight change.  HENT: Negative for rhinorrhea.   Respiratory: Negative for cough and shortness of breath.   Cardiovascular: Negative for chest pain.  Gastrointestinal: Negative for constipation and diarrhea.  Genitourinary: Negative for difficulty urinating and menstrual problem.  Musculoskeletal: Negative for arthralgias and myalgias.  Skin: Negative for rash.  Allergic/Immunologic: Negative for environmental allergies.  Neurological: Negative for dizziness, numbness and headaches.  Psychiatric/Behavioral:       No concerns for anxiety or depression       Past Medical History:  Diagnosis Date  . Barrett esophagus   . GERD (gastroesophageal reflux disease)   . Heart murmur   . Hypertension      Social History   Social History  . Marital status: Married    Spouse name: N/A  . Number of children: N/A  . Years of education: N/A   Occupational History  . Not on file.   Social History Main Topics  . Smoking status: Current Every Day Smoker    Packs/day: 0.50    Years: 25.00    Types:  Cigarettes  . Smokeless tobacco: Never Used  . Alcohol use 1.2 oz/week    2 Standard drinks or equivalent per week  . Drug use: Unknown  . Sexual activity: Not on file   Other Topics Concern  . Not on file   Social History Narrative   Married.   3 children.   Worked as a Runner, broadcasting/film/video.    Enjoys waking, bicycling, driving.    Past Surgical History:  Procedure Laterality Date  . CESAREAN SECTION    . CHOLECYSTECTOMY    . ECTOPIC PREGNANCY SURGERY      Family History  Problem Relation Age of Onset  . Hypertension Mother   . Lung cancer Maternal Grandmother     Allergies  Allergen Reactions  . Morphine And Related Hives    Current Outpatient Prescriptions on File Prior to Visit  Medication Sig Dispense Refill  . amLODipine (NORVASC) 10 MG tablet Take 1 tablet (10 mg total) by mouth daily. 90 tablet 3  . metoprolol succinate (TOPROL-XL) 50 MG 24 hr tablet Take 1 tablet (50 mg total) by mouth daily. Take with or immediately following a meal. 90 tablet 3  . olopatadine (PATANOL) 0.1 % ophthalmic solution Place 1 drop into both eyes 2 (two) times daily. 5 mL 1   No current facility-administered medications on file prior to visit.     BP 130/84   Pulse 87   Temp 98.1 F (36.7 C) (Oral)   Ht 5' 2.5" (1.588 m)   Wt 153 lb (69.4 kg)   SpO2  98%   BMI 27.54 kg/m    Objective:   Physical Exam  Constitutional: She is oriented to person, place, and time. She appears well-nourished.  HENT:  Right Ear: Tympanic membrane and ear canal normal.  Left Ear: Tympanic membrane and ear canal normal.  Nose: Nose normal.  Mouth/Throat: Oropharynx is clear and moist.  Eyes: Pupils are equal, round, and reactive to light. Conjunctivae and EOM are normal.  Neck: Neck supple. No thyromegaly present.  Cardiovascular: Normal rate and regular rhythm.   No murmur heard. Pulmonary/Chest: Effort normal and breath sounds normal. She has no rales.  Abdominal: Soft. Bowel sounds are normal.  There is no tenderness.  Musculoskeletal: Normal range of motion.  Lymphadenopathy:    She has no cervical adenopathy.  Neurological: She is alert and oriented to person, place, and time. She has normal reflexes. No cranial nerve deficit.  Skin: Skin is warm and dry. No rash noted.  Psychiatric: She has a normal mood and affect.          Assessment & Plan:

## 2016-12-09 LAB — CYTOLOGY - PAP
DIAGNOSIS: NEGATIVE
HPV: NOT DETECTED

## 2016-12-14 ENCOUNTER — Encounter: Payer: Commercial Managed Care - PPO | Admitting: Primary Care

## 2017-04-28 ENCOUNTER — Ambulatory Visit (INDEPENDENT_AMBULATORY_CARE_PROVIDER_SITE_OTHER)
Admission: RE | Admit: 2017-04-28 | Discharge: 2017-04-28 | Disposition: A | Discharge status: Home / Self Care | Payer: Commercial Managed Care - PPO | Source: Ambulatory Visit | Attending: Family Medicine | Admitting: Family Medicine

## 2017-04-28 ENCOUNTER — Ambulatory Visit: Payer: Commercial Managed Care - PPO | Admitting: Family Medicine

## 2017-04-28 ENCOUNTER — Encounter: Payer: Self-pay | Admitting: Family Medicine

## 2017-04-28 VITALS — BP 140/80 | HR 79 | Temp 98.3°F | Wt 147.2 lb

## 2017-04-28 DIAGNOSIS — G8929 Other chronic pain: Secondary | ICD-10-CM

## 2017-04-28 DIAGNOSIS — M25511 Pain in right shoulder: Secondary | ICD-10-CM | POA: Diagnosis not present

## 2017-04-28 DIAGNOSIS — S4991XA Unspecified injury of right shoulder and upper arm, initial encounter: Secondary | ICD-10-CM

## 2017-04-28 MED ORDER — MELOXICAM 15 MG PO TABS: 15.0000 mg | ORAL_TABLET | Freq: Every day | ORAL | 0 refills | Status: DC

## 2017-04-28 NOTE — Patient Instructions (Signed)
Please schedule an appointment with Dr. Patsy Lageropland

## 2017-04-28 NOTE — Progress Notes (Signed)
   Subjective:    Patient ID: Christina Bell, female    DOB: May 19, 1965, 51 y.o.   MRN: [id number]  HPI This is a 51 yo female who presents today with right shoulder pain x 3 weeks. She fell at the grocery store about 3 months ago. Fell on a wet floor onto knees and bent right arm. Has been taking ibuprofen 800 mg 1-2 times a day for last month. Feels weak and hand numb at times. Has difficulty raising arm.   Past Medical History:  Diagnosis Date  . Barrett esophagus   . GERD (gastroesophageal reflux disease)   . Heart murmur   . Hypertension    Past Surgical History:  Procedure Laterality Date  . CESAREAN SECTION    . CHOLECYSTECTOMY    . ECTOPIC PREGNANCY SURGERY     Family History  Problem Relation Age of Onset  . Hypertension Mother   . Lung cancer Maternal Grandmother    Social History   Tobacco Use  . Smoking status: Current Every Day Smoker    Packs/day: 0.50    Years: 25.00    Pack years: 12.50    Types: Cigarettes  . Smokeless tobacco: Never Used  Substance Use Topics  . Alcohol use: Yes    Alcohol/week: 1.2 oz    Types: 2 Standard drinks or equivalent per week  . Drug use: Not on file      Review of Systems Per HPI    Objective:   Physical Exam  Constitutional: She appears well-developed and well-nourished. No distress.  HENT:  Head: Normocephalic and atraumatic.  Eyes: Conjunctivae are normal.  Cardiovascular: Normal rate.  Pulmonary/Chest: Effort normal.  Musculoskeletal:       Right shoulder: She exhibits decreased range of motion (abduction, flexion, internal rotation), tenderness and pain.  Skin: She is not diaphoretic.  Vitals reviewed.     BP 140/80 (BP Location: Left Arm, Patient Position: Sitting, Cuff Size: Normal)   Pulse 79   Temp 98.3 F (36.8 C) (Oral)   Wt 147 lb 4 oz (66.8 kg)   SpO2 97%   BMI 26.50 kg/m      Assessment & Plan:  Discussed with Dr. Patsy Lageropland 1. Injury of right shoulder, initial encounter - DG Shoulder  Right; Future  2. Chronic right shoulder pain - will check xray and have her see Dr. Patsy Lageropland for possible injection - DG Shoulder Right; Future - meloxicam (MOBIC) 15 MG tablet; Take 1 tablet (15 mg total) by mouth daily.  Dispense: 30 tablet; Refill: 0   Olean Reeeborah Rozann Holts, FNP-BC  [facility]  04/30/2017 5:23 PM

## 2017-06-26 ENCOUNTER — Other Ambulatory Visit: Payer: Self-pay | Admitting: Primary Care

## 2017-06-26 DIAGNOSIS — I10 Essential (primary) hypertension: Secondary | ICD-10-CM

## 2017-07-01 ENCOUNTER — Encounter: Payer: Self-pay | Admitting: Adult Health

## 2017-07-01 ENCOUNTER — Ambulatory Visit: Payer: Commercial Managed Care - PPO | Admitting: Adult Health

## 2017-07-01 VITALS — BP 138/74 | HR 73 | Temp 99.3°F | Wt 145.0 lb

## 2017-07-01 DIAGNOSIS — H6690 Otitis media, unspecified, unspecified ear: Secondary | ICD-10-CM | POA: Diagnosis not present

## 2017-07-01 DIAGNOSIS — J029 Acute pharyngitis, unspecified: Secondary | ICD-10-CM | POA: Diagnosis not present

## 2017-07-01 LAB — POC INFLUENZA A&B (BINAX/QUICKVUE)
INFLUENZA A, POC: NEGATIVE
INFLUENZA B, POC: NEGATIVE

## 2017-07-01 LAB — POCT RAPID STREP A (OFFICE): RAPID STREP A SCREEN: NEGATIVE

## 2017-07-01 MED ORDER — AMOXICILLIN-POT CLAVULANATE 875-125 MG PO TABS: 1.0000 | ORAL_TABLET | Freq: Two times a day (BID) | ORAL | 0 refills | Status: AC

## 2017-07-01 NOTE — Progress Notes (Signed)
Subjective:    Patient ID: Christina Bell, female    DOB: [birth year], 52 y.o.   MRN: [id number]  Cough  Associated symptoms include chills, ear pain, postnasal drip, a sore throat and wheezing. Pertinent negatives include no fever, rhinorrhea or shortness of breath.  Sore Throat   Associated symptoms include congestion, coughing and ear pain. Pertinent negatives include no shortness of breath or trouble swallowing.    Starting having a sore throat on Sunday without other symptoms.  Monday she started coughing with ear pain and pressure, headache, sinus pressure/pain, fatigue.  No fever at home 98.9 but she has had chills and hot flashes.  Has tried OTC Coricidin HBP, benadryl and Delsym without any relief. Has been taking care of her grandson that had similar symptoms.  Her cough is non-productive.  Her nasal mucus production is yellow.  She has a poor appetite but is managing fluids.  No nausea, vomiting or abdominal .  Did cough yesterday until she made herself vomit but was not brought on by nausea.    Review of Systems  Constitutional: Positive for appetite change, chills and fatigue. Negative for fever.  HENT: Positive for congestion, ear pain, postnasal drip, sinus pressure, sinus pain and sore throat. Negative for rhinorrhea, sneezing and trouble swallowing.   Respiratory: Positive for cough and wheezing. Negative for chest tightness and shortness of breath.   Cardiovascular: Negative.   Gastrointestinal: Negative.    Past Medical History:  Diagnosis Date  . Barrett esophagus   . GERD (gastroesophageal reflux disease)   . Heart murmur   . Hypertension     Social History   Socioeconomic History  . Marital status: Married    Spouse name: Not on file  . Number of children: Not on file  . Years of education: Not on file  . Highest education level: Not on file  Social Needs  . Financial resource strain: Not on file  . Food insecurity - worry: Not on file  . Food insecurity -  inability: Not on file  . Transportation needs - medical: Not on file  . Transportation needs - non-medical: Not on file  Occupational History  . Not on file  Tobacco Use  . Smoking status: Current Every Day Smoker    Packs/day: 0.50    Years: 25.00    Pack years: 12.50    Types: Cigarettes  . Smokeless tobacco: Never Used  Substance and Sexual Activity  . Alcohol use: Yes    Alcohol/week: 1.2 oz    Types: 2 Standard drinks or equivalent per week  . Drug use: Not on file  . Sexual activity: Not on file  Other Topics Concern  . Not on file  Social History Narrative   Married.   3 children.   Worked as a Runner, broadcasting/film/videoTeacher.    Enjoys waking, bicycling, driving.    Past Surgical History:  Procedure Laterality Date  . CESAREAN SECTION    . CHOLECYSTECTOMY    . ECTOPIC PREGNANCY SURGERY      Family History  Problem Relation Age of Onset  . Hypertension Mother   . Lung cancer Maternal Grandmother     Allergies  Allergen Reactions  . Morphine And Related Hives    Current Outpatient Medications on File Prior to Visit  Medication Sig Dispense Refill  . amLODipine (NORVASC) 10 MG tablet TAKE 1 TABLET (10 MG TOTAL) BY MOUTH DAILY. 90 tablet 1  . hydrochlorothiazide (HYDRODIURIL) 25 MG tablet Take 25 mg by mouth daily.  0  . metoprolol succinate (TOPROL-XL) 50 MG 24 hr tablet TAKE 1 TABLET (50 MG TOTAL) BY MOUTH DAILY. TAKE WITH OR IMMEDIATELY FOLLOWING A MEAL. 90 tablet 1  . omeprazole (PRILOSEC) 40 MG capsule Take 1 capsule (40 mg total) by mouth daily. 90 capsule 3   No current facility-administered medications on file prior to visit.     BP 138/74   Pulse 73   Temp 99.3 F (37.4 C) (Oral)   Wt 145 lb (65.8 kg)   SpO2 98%   BMI 26.10 kg/m      Objective:   Physical Exam  Constitutional: She appears well-developed and well-nourished. She appears distressed.  HENT:  Right Ear: Tympanic membrane and ear canal normal.  Left Ear: There is tenderness. Tympanic membrane is  erythematous and bulging.  Nose: Mucosal edema present. Right sinus exhibits no maxillary sinus tenderness and no frontal sinus tenderness. Left sinus exhibits maxillary sinus tenderness. Left sinus exhibits no frontal sinus tenderness.  Mouth/Throat: Mucous membranes are normal. Oropharyngeal exudate, posterior oropharyngeal edema and posterior oropharyngeal erythema present.  Inferior turbinates are erythematous and swollen left greater than right.   Cardiovascular: Normal rate and regular rhythm.  Murmur heard. 2/6 RUSB  Pulmonary/Chest: Effort normal and breath sounds normal.  Lymphadenopathy:       Head (left side): Tonsillar adenopathy present.  Nursing note and vitals reviewed.     Assessment & Plan:  1. Flu-like symptoms Influenza swab negative - POC Influenza A&B(BINAX/QUICKVUE)  2. Sore throat Rapid swab negative Augmentin 875-125 mg PO BID for 10 days - POC Rapid Strep A  Continue Delsym for cough.  Encourage hot tea, hot soup, and fluids as tolerated.  Tylenol or Motrin as needed for fever and discomfort.

## 2017-07-01 NOTE — Progress Notes (Signed)
Subjective:    Patient ID: Christina Bell, female    DOB: January 29, 1966, 52 y.o.   MRN: [id number]  URI   This is a new problem. The current episode started in the past 7 days. The problem has been gradually worsening. There has been no fever. Associated symptoms include congestion, coughing (dry cough ), ear pain, rhinorrhea and sinus pain (left ). Pertinent negatives include no diarrhea, nausea, sore throat or vomiting.    Review of Systems  Constitutional: Positive for fatigue. Negative for chills, diaphoresis and fever.  HENT: Positive for congestion, ear pain, postnasal drip, rhinorrhea, sinus pressure and sinus pain (left ). Negative for sore throat and trouble swallowing.   Respiratory: Positive for cough (dry cough ).   Cardiovascular: Negative.   Gastrointestinal: Negative for diarrhea, nausea and vomiting.   Past Medical History:  Diagnosis Date  . Barrett esophagus   . GERD (gastroesophageal reflux disease)   . Heart murmur   . Hypertension     Social History   Socioeconomic History  . Marital status: Married    Spouse name: Not on file  . Number of children: Not on file  . Years of education: Not on file  . Highest education level: Not on file  Social Needs  . Financial resource strain: Not on file  . Food insecurity - worry: Not on file  . Food insecurity - inability: Not on file  . Transportation needs - medical: Not on file  . Transportation needs - non-medical: Not on file  Occupational History  . Not on file  Tobacco Use  . Smoking status: Current Every Day Smoker    Packs/day: 0.50    Years: 25.00    Pack years: 12.50    Types: Cigarettes  . Smokeless tobacco: Never Used  Substance and Sexual Activity  . Alcohol use: Yes    Alcohol/week: 1.2 oz    Types: 2 Standard drinks or equivalent per week  . Drug use: Not on file  . Sexual activity: Not on file  Other Topics Concern  . Not on file  Social History Narrative   Married.   3 children.   Worked as a Runner, broadcasting/film/videoTeacher.    Enjoys waking, bicycling, driving.    Past Surgical History:  Procedure Laterality Date  . CESAREAN SECTION    . CHOLECYSTECTOMY    . ECTOPIC PREGNANCY SURGERY      Family History  Problem Relation Age of Onset  . Hypertension Mother   . Lung cancer Maternal Grandmother     Allergies  Allergen Reactions  . Morphine And Related Hives    Current Outpatient Medications on File Prior to Visit  Medication Sig Dispense Refill  . amLODipine (NORVASC) 10 MG tablet TAKE 1 TABLET (10 MG TOTAL) BY MOUTH DAILY. 90 tablet 1  . hydrochlorothiazide (HYDRODIURIL) 25 MG tablet Take 25 mg by mouth daily.  0  . metoprolol succinate (TOPROL-XL) 50 MG 24 hr tablet TAKE 1 TABLET (50 MG TOTAL) BY MOUTH DAILY. TAKE WITH OR IMMEDIATELY FOLLOWING A MEAL. 90 tablet 1  . omeprazole (PRILOSEC) 40 MG capsule Take 1 capsule (40 mg total) by mouth daily. 90 capsule 3   No current facility-administered medications on file prior to visit.     BP 138/74   Pulse 73   Temp 99.3 F (37.4 C) (Oral)   Wt 145 lb (65.8 kg)   SpO2 98%   BMI 26.10 kg/m       Objective:   Physical Exam  Constitutional: She is oriented to person, place, and time. She appears well-developed and well-nourished. No distress.  HENT:  Right Ear: Hearing, external ear and ear canal normal. Tympanic membrane is bulging.  Left Ear: Hearing, external ear and ear canal normal. Tympanic membrane is erythematous and bulging.  Nose: Mucosal edema and rhinorrhea present. Left sinus exhibits maxillary sinus tenderness.  Mouth/Throat: Uvula is midline and mucous membranes are normal. Normal dentition. Oropharyngeal exudate, posterior oropharyngeal edema and posterior oropharyngeal erythema present.  Eyes: Conjunctivae and EOM are normal. Pupils are equal, round, and reactive to light. Right eye exhibits no discharge. Left eye exhibits no discharge. No scleral icterus.  Cardiovascular: Normal rate, regular rhythm and  intact distal pulses. Exam reveals no gallop and no friction rub.  Murmur heard. Pulmonary/Chest: Effort normal. No respiratory distress. She has wheezes. She has no rales. She exhibits no tenderness.  Neurological: She is alert and oriented to person, place, and time.  Skin: Skin is warm and dry. No rash noted. She is not diaphoretic. No erythema. No pallor.  Psychiatric: She has a normal mood and affect. Her behavior is normal. Judgment and thought content normal.  Nursing note and vitals reviewed.     Assessment & Plan:  1. Acute otitis media, unspecified otitis media type - Will treat with Augmentin for otitis media and cover for sinusitis  - POC Influenza A&B(BINAX/QUICKVUE)- negative  - POC Rapid Strep A- negative  - amoxicillin-clavulanate (AUGMENTIN) 875-125 MG tablet; Take 1 tablet by mouth 2 (two) times daily for 10 days.  Dispense: 20 tablet; Refill: 0 - Follow up with PCP if symptoms do not resolve  2. Sore throat - POC Rapid Strep A   Shirline Frees, NP

## 2017-11-10 ENCOUNTER — Encounter (HOSPITAL_COMMUNITY): Payer: Self-pay

## 2017-11-10 ENCOUNTER — Ambulatory Visit (HOSPITAL_COMMUNITY)
Admission: EM | Admit: 2017-11-10 | Discharge: 2017-11-10 | Disposition: A | Discharge status: Home / Self Care | Payer: Commercial Managed Care - PPO | Attending: Family Medicine | Admitting: Family Medicine

## 2017-11-10 DIAGNOSIS — M62838 Other muscle spasm: Secondary | ICD-10-CM | POA: Diagnosis not present

## 2017-11-10 DIAGNOSIS — S161XXA Strain of muscle, fascia and tendon at neck level, initial encounter: Secondary | ICD-10-CM

## 2017-11-10 MED ORDER — CYCLOBENZAPRINE HCL 10 MG PO TABS: 10.0000 mg | ORAL_TABLET | Freq: Three times a day (TID) | ORAL | 0 refills | Status: DC

## 2017-11-10 NOTE — ED Provider Notes (Signed)
[facility]   [id number] 11/10/17 Arrival Time: 0957  ASSESSMENT & PLAN:  1. Muscle spasms of neck   2. Strain of neck muscle, initial encounter    Meds ordered this encounter  Medications  . cyclobenzaprine (FLEXERIL) 10 MG tablet    Sig: Take 1 tablet (10 mg total) by mouth 3 (three) times daily.    Dispense:  20 tablet    Refill:  0   Ibuprofen 800mg  TID with food. Work note given.  Follow-up Information    Doreene Nestlark, Katherine K, NP.   Specialty:  Internal Medicine Why:  As needed. Contact information: [address] [place] [id number] [phone]          Medication sedation precautions.  Reviewed expectations re: course of current medical issues. Questions answered. Outlined signs and symptoms indicating need for more acute intervention. Patient verbalized understanding. After Visit Summary given.  SUBJECTIVE: History from: patient. Christina PageHeather Bell is a 52 y.o. female who reports fairly persistent mild to moderate pain of her left neck that is stable; described as "tight feeling" without radiation. Onset: gradual, about 3 days ago. Injury/trama: no, but questions turning a certain way at work before pain started. Relieved by: nothing in particular. Worsened by: certain movements. Associated symptoms: none reported. Extremity sensation changes or weakness: occasional and transient "tingling" of her R arm; no extremity weakness. Self treatment: ibuprofen once daily with some help. History of similar: no  ROS: As per HPI.   OBJECTIVE:  Vitals:   11/10/17 1015  BP: 121/80  Pulse: 70  Resp: 20  Temp: 98.2 F (36.8 C)  TempSrc: Oral  SpO2: 96%    General appearance: alert; no distress Extremities: warm and well perfused; symmetrical with no gross deformities; localized tenderness over her left neck musculature with no swelling and no bruising; ROM: limited by pain when turning her head to the left CV: normal extremity capillary  refill Skin: warm and dry Neurologic: normal gait; normal symmetric reflexes in all extremities; normal sensation in all extremities Psychological: alert and cooperative; normal mood and affect  Allergies  Allergen Reactions  . Morphine And Related Hives    Past Medical History:  Diagnosis Date  . Barrett esophagus   . GERD (gastroesophageal reflux disease)   . Heart murmur   . Hypertension    Social History   Socioeconomic History  . Marital status: Married    Spouse name: Not on file  . Number of children: Not on file  . Years of education: Not on file  . Highest education level: Not on file  Occupational History  . Not on file  Social Needs  . Financial resource strain: Not on file  . Food insecurity:    Worry: Not on file    Inability: Not on file  . Transportation needs:    Medical: Not on file    Non-medical: Not on file  Tobacco Use  . Smoking status: Current Every Day Smoker    Packs/day: 0.50    Years: 25.00    Pack years: 12.50    Types: Cigarettes  . Smokeless tobacco: Never Used  Substance and Sexual Activity  . Alcohol use: Yes    Alcohol/week: 1.2 oz    Types: 2 Standard drinks or equivalent per week  . Drug use: Not on file  . Sexual activity: Not on file  Lifestyle  . Physical activity:    Days per week: Not on file    Minutes per session: Not on  file  . Stress: Not on file  Relationships  . Social connections:    Talks on phone: Not on file    Gets together: Not on file    Attends religious service: Not on file    Active member of club or organization: Not on file    Attends meetings of clubs or organizations: Not on file    Relationship status: Not on file  . Intimate partner violence:    Fear of current or ex partner: Not on file    Emotionally abused: Not on file    Physically abused: Not on file    Forced sexual activity: Not on file  Other Topics Concern  . Not on file  Social History Narrative   Married.   3 children.    Worked as a Runner, broadcasting/film/video.    Enjoys waking, bicycling, driving.   Family History  Problem Relation Age of Onset  . Hypertension Mother   . Lung cancer Maternal Grandmother    Past Surgical History:  Procedure Laterality Date  . CESAREAN SECTION    . CHOLECYSTECTOMY    . ECTOPIC PREGNANCY SURGERY        Mardella Layman, MD 11/10/17 [phone]

## 2017-11-10 NOTE — ED Triage Notes (Signed)
Pt states the pain has been going on in her neck and back of her had for a few days, and it makes her right arm hurt and tingle. She also states she has limited ROM with her neck.

## 2017-11-11 ENCOUNTER — Encounter (HOSPITAL_COMMUNITY): Payer: Self-pay

## 2017-11-11 ENCOUNTER — Emergency Department (HOSPITAL_COMMUNITY)
Admission: EM | Admit: 2017-11-11 | Discharge: 2017-11-11 | Disposition: A | Discharge status: Home / Self Care | Payer: Commercial Managed Care - PPO | Attending: Emergency Medicine | Admitting: Emergency Medicine

## 2017-11-11 ENCOUNTER — Other Ambulatory Visit: Payer: Self-pay

## 2017-11-11 ENCOUNTER — Telehealth (HOSPITAL_COMMUNITY): Payer: Self-pay | Admitting: Family Medicine

## 2017-11-11 DIAGNOSIS — Z79899 Other long term (current) drug therapy: Secondary | ICD-10-CM | POA: Insufficient documentation

## 2017-11-11 DIAGNOSIS — M542 Cervicalgia: Secondary | ICD-10-CM | POA: Diagnosis present

## 2017-11-11 DIAGNOSIS — G243 Spasmodic torticollis: Secondary | ICD-10-CM | POA: Diagnosis not present

## 2017-11-11 DIAGNOSIS — I10 Essential (primary) hypertension: Secondary | ICD-10-CM | POA: Insufficient documentation

## 2017-11-11 DIAGNOSIS — F17210 Nicotine dependence, cigarettes, uncomplicated: Secondary | ICD-10-CM | POA: Insufficient documentation

## 2017-11-11 MED ORDER — DIAZEPAM 5 MG PO TABS: 5.0000 mg | ORAL_TABLET | Freq: Once | ORAL | 0 refills | Status: DC | PRN

## 2017-11-11 MED ORDER — TRAMADOL HCL 50 MG PO TABS: 50.0000 mg | ORAL_TABLET | Freq: Every evening | ORAL | 0 refills | Status: DC | PRN

## 2017-11-11 MED ORDER — DIAZEPAM 5 MG PO TABS
5.0000 mg | ORAL_TABLET | Freq: Once | ORAL | Status: AC
  Administered 2017-11-11: 5 mg via ORAL
  Filled 2017-11-11: qty 1

## 2017-11-11 NOTE — Telephone Encounter (Signed)
Patient reports no improvement. Still with neck pain. I discussed this may take up to one week to seem much improvement. Will send in Rx Ultram to take at night for a few days. She may need PT if not improving.

## 2017-11-11 NOTE — ED Provider Notes (Signed)
[facility] Provider Note   CSN: [id number] Arrival date & time: 11/11/17  1253     History   Chief Complaint Chief Complaint  Patient presents with  . Torticollis    HPI Christina Bell is a 52 y.o. female presenting for evaluation of left-sided neck pain.  Patient states that several weeks ago, she was having right neck and shoulder pain.  She felt like she is sleeping funny because of this.  The sxs on the R side have resolved. Over the past 3 days, she has had worsening left-sided neck pain.  She describes it as a spasm.  She was seen at urgent care yesterday, given Flexeril.  She is taken through those doses without improvement of her symptoms.  She was seen again at urgent care this morning, given tramadol which she has not picked up.  She has been taking ibuprofen without improvement of pain.  She states that the pain worsened prior to coming to the ER.  She states it is constant, worse when she tries to move her head.  Denies fall, trauma, or injury.  She denies headache, back pain, or shoulder pain.  She is not dropping things with her left hand, denies numbness, tingling, or weakness of the left hand.  She denies history of similar.  She has an appointment with her primary care doctor tomorrow.  HPI  Past Medical History:  Diagnosis Date  . Barrett esophagus   . GERD (gastroesophageal reflux disease)   . Heart murmur   . Hypertension     Patient Active Problem List   Diagnosis Date Noted  . Hyperlipidemia 12/07/2016  . Elevated LFTs 12/07/2016  . Preventative health care 12/07/2016  . Recurrent UTI 08/28/2016  . Allergic conjunctivitis 08/28/2016  . Essential hypertension 06/30/2016  . Gastroesophageal reflux disease 06/30/2016    Past Surgical History:  Procedure Laterality Date  . CESAREAN SECTION    . CHOLECYSTECTOMY    . ECTOPIC PREGNANCY SURGERY       OB History   None      Home Medications    Prior to  Admission medications   Medication Sig Start Date End Date Taking? Authorizing Provider  amLODipine (NORVASC) 10 MG tablet TAKE 1 TABLET (10 MG TOTAL) BY MOUTH DAILY. 06/28/17   Doreene Nestlark, Katherine K, NP  cyclobenzaprine (FLEXERIL) 10 MG tablet Take 1 tablet (10 mg total) by mouth 3 (three) times daily. 11/10/17   Mardella LaymanHagler, Brian, MD  diazepam (VALIUM) 5 MG tablet Take 1 tablet (5 mg total) by mouth once as needed for up to 1 dose for muscle spasms. 11/11/17   Ortencia Askari, PA-C  hydrochlorothiazide (HYDRODIURIL) 25 MG tablet Take 25 mg by mouth daily. 02/12/17   [provider]  metoprolol succinate (TOPROL-XL) 50 MG 24 hr tablet TAKE 1 TABLET (50 MG TOTAL) BY MOUTH DAILY. TAKE WITH OR IMMEDIATELY FOLLOWING A MEAL. 06/28/17   Doreene Nestlark, Katherine K, NP  omeprazole (PRILOSEC) 40 MG capsule Take 1 capsule (40 mg total) by mouth daily. 12/07/16   Doreene Nestlark, Katherine K, NP  traMADol (ULTRAM) 50 MG tablet Take 1 tablet (50 mg total) by mouth at bedtime as needed for moderate pain. 11/11/17   Mardella LaymanHagler, Brian, MD    Family History Family History  Problem Relation Age of Onset  . Hypertension Mother   . Lung cancer Maternal Grandmother     Social History Social History   Tobacco Use  . Smoking status: Current Every Day Smoker  Packs/day: 0.50    Years: 25.00    Pack years: 12.50    Types: Cigarettes  . Smokeless tobacco: Never Used  Substance Use Topics  . Alcohol use: Yes    Alcohol/week: 1.2 oz    Types: 2 Standard drinks or equivalent per week  . Drug use: Not on file     Allergies   Morphine and related   Review of Systems Review of Systems  Musculoskeletal: Positive for neck pain and neck stiffness. Negative for gait problem.  Neurological: Negative for facial asymmetry and numbness.  Hematological: Does not bruise/bleed easily.     Physical Exam Updated Vital Signs BP (!) 144/85 (BP Location: Right Arm)   Pulse 77   Temp 97.8 F (36.6 C) (Oral)   Resp 18   SpO2 97%    Physical Exam  Constitutional: She is oriented to person, place, and time. She appears well-developed and well-nourished. No distress.  HENT:  Head: Normocephalic and atraumatic.  Mouth/Throat: Uvula is midline, oropharynx is clear and moist and mucous membranes are normal.  Eyes: Pupils are equal, round, and reactive to light. EOM are normal.  EOMI and PERRLA.  Neck: Normal range of motion.  Cardiovascular: Normal rate, regular rhythm and intact distal pulses.  Pulmonary/Chest: Effort normal and breath sounds normal. No respiratory distress. She has no wheezes.  Abdominal: She exhibits no distension.  Musculoskeletal: Normal range of motion. She exhibits tenderness. She exhibits no edema or deformity.  Tenderness palpation of left-sided neck musculature.  No pain over midline spine.  No step-offs or deformities.  Patient reports less pain at this time than prior to treatment (valium) in the ER. Grip strength intact bilaterally.  Radial pulses intact bilaterally.  Sensation intact bilaterally.  Neurological: She is alert and oriented to person, place, and time. No sensory deficit.  Skin: Skin is warm. No rash noted.  Psychiatric: She has a normal mood and affect.  Nursing note and vitals reviewed.    ED Treatments / Results  Labs (all labs ordered are listed, but only abnormal results are displayed) Labs Reviewed - No data to display  EKG None  Radiology No results found.  Procedures Procedures (including critical care time)  Medications Ordered in ED Medications  diazepam (VALIUM) tablet 5 mg (5 mg Oral Given 11/11/17 1314)     Initial Impression / Assessment and Plan / ED Course  I have reviewed the triage vital signs and the nursing notes.  Pertinent labs & imaging results that were available during my care of the patient were reviewed by me and considered in my medical decision making (see chart for details).     Patient presenting for evaluation of left-sided  neck pain.  Physical exam reassuring, she is neurovascularly intact.  No fall or injury.  No neurologic symptoms including numbness or weakness.  Pain is reproducible with palpation of the musculature.  likely torticollis, doubt spinal cord compression, myelopathy, or radiculopathy. No pain over midline spine.  Likely torticollis.  Patient reports improvement with Valium given in the ER.  Discussed with patient and family use of anti-inflammatories, muscle relaxers, heat, and muscle creams.  Soft neck brace given in the ER.  Patient to follow-up with her PCP tomorrow at her scheduled appointment for further evaluation and management.  At this time, patient appears a for discharge.  Return precautions given.  Patient states she understands and agrees plan.   Final Clinical Impressions(s) / ED Diagnoses   Final diagnoses:  Spasmodic torticollis  ED Discharge Orders        Ordered    diazepam (VALIUM) 5 MG tablet  Once PRN     11/11/17 1406       Adelma Bowdoin, PA-C 11/11/17 1739    Vanetta Mulders, MD 11/15/17 [phone]

## 2017-11-11 NOTE — ED Triage Notes (Signed)
Pt reports she has been having neck pain for several days. States decreased ROM and painful to turn her head. Pt states she has rx for flexeril but no improvement.

## 2017-11-11 NOTE — ED Notes (Signed)
Patient able to ambulate independently  

## 2017-11-11 NOTE — ED Provider Notes (Signed)
Patient placed in Quick Look pathway, seen and evaluated   Chief Complaint: neck pain  HPI:  Christina Bell is a 52 y.o. female who presents to the ED with neck pain. The pain started 3 weeks ago. Patient reports she woke one morning with the pain and it has gotten worse since then. Patient reports going to Urgent Care yesterday and was started on Flexeril and Tramadol. Patient also taking ibuprofen. Patient reports no relief and the pain is worse.   ROS: Neck: decreased range of motion due to pain  Physical Exam:  BP (!) 144/85 (BP Location: Right Arm)   Pulse 77   Temp 97.8 F (36.6 C) (Oral)   Resp 18   SpO2 97%    Gen: No distress  Neuro: Awake and Alert  Skin: Warm and dry  Neck: muscular tenderness left side, limited range of motion due to pain. No JVD.      Initiation of care has begun. The patient has been counseled on the process, plan, and necessity for staying for the completion/evaluation, and the remainder of the medical screening examination    Janne Napoleoneese, Other Atienza M, NP 11/11/17 1309    Arby BarrettePfeiffer, Marcy, MD 11/19/17 [phone]

## 2017-11-11 NOTE — Discharge Instructions (Signed)
Continue taking antiinflammatories, your muscle relaxer, tramadol as needed. Use the soft neck collar for support and comfort. Take the Valium as needed for severe spasms.  Do not drive while taking this medicine. Follow-up with your doctor at your scheduled appointment tomorrow. Return to the emergency room if you start dropping things with your hand, have weakness or numbness of your arm, or with any new or concerning symptoms.

## 2017-11-12 ENCOUNTER — Ambulatory Visit (INDEPENDENT_AMBULATORY_CARE_PROVIDER_SITE_OTHER)
Admission: RE | Admit: 2017-11-12 | Discharge: 2017-11-12 | Disposition: A | Discharge status: Home / Self Care | Payer: Commercial Managed Care - PPO | Source: Ambulatory Visit | Attending: Family Medicine | Admitting: Family Medicine

## 2017-11-12 ENCOUNTER — Encounter: Payer: Self-pay | Admitting: Family Medicine

## 2017-11-12 ENCOUNTER — Ambulatory Visit (INDEPENDENT_AMBULATORY_CARE_PROVIDER_SITE_OTHER): Payer: Commercial Managed Care - PPO | Admitting: Family Medicine

## 2017-11-12 VITALS — BP 100/70 | HR 91 | Temp 97.5°F | Ht 62.5 in | Wt 146.2 lb

## 2017-11-12 DIAGNOSIS — M542 Cervicalgia: Secondary | ICD-10-CM

## 2017-11-12 DIAGNOSIS — G8929 Other chronic pain: Secondary | ICD-10-CM | POA: Insufficient documentation

## 2017-11-12 HISTORY — DX: Cervicalgia: M54.2

## 2017-11-12 MED ORDER — KETOROLAC TROMETHAMINE 60 MG/2ML IM SOLN
60.0000 mg | Freq: Once | INTRAMUSCULAR | Status: AC
  Administered 2017-11-12: 60 mg via INTRAMUSCULAR

## 2017-11-12 MED ORDER — PREDNISONE 20 MG PO TABS: ORAL_TABLET | ORAL | 0 refills | Status: DC

## 2017-11-12 NOTE — Assessment & Plan Note (Signed)
Definitely muscle spasm but given numbness.. Will eval with X-ray.  X-ray showed significant  OA , DD in cervical spine on my read. No clear fracture. Await radiology reading.  Continue muscle relaxant, start prednisone taper.Marland Kitchen. given toradol in office.  Increase PPI and stop ibuprofen given Barrett's and stomach upset.
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
## 2017-11-12 NOTE — Patient Instructions (Addendum)
Increase omeprazole to 40 mg twice daily in next few days. Massage. Limit ROM for now. Start prednisone taper for pain and inflammation tommorow AM. Can use muscle relaxant as needed.  Follow up with PCP in 1-2 weeks if not improving as expected for further imaging.

## 2017-11-15 ENCOUNTER — Telehealth: Payer: Self-pay | Admitting: Primary Care

## 2017-11-15 NOTE — Telephone Encounter (Signed)
Copied from CRM [id number]. Topic: General - Other >> Nov 15, 2017  8:57 AM Christina Bell, [name], NT wrote: Reason for CRM: Pt is needing a note for work that she was out on Friday and still out today. She does not know when she will be able to return.

## 2017-11-17 NOTE — Telephone Encounter (Signed)
Okay to write note for pt to return. 7/5. If pt not better by that day make appt with PCP.

## 2017-11-17 NOTE — Telephone Encounter (Signed)
Work note written.  Left message for Christina Bell that her work note is ready to be picked up at the front desk.  Dr. Ermalene SearingBedsole has her returning to work on 11/19/17.  If she is unable to return to work on Friday, then she will need an appointment to be re-evaluated.

## 2017-12-27 ENCOUNTER — Other Ambulatory Visit: Payer: Self-pay | Admitting: Primary Care

## 2017-12-27 DIAGNOSIS — I10 Essential (primary) hypertension: Secondary | ICD-10-CM

## 2017-12-28 ENCOUNTER — Other Ambulatory Visit: Payer: Self-pay | Admitting: Primary Care

## 2017-12-28 DIAGNOSIS — K219 Gastro-esophageal reflux disease without esophagitis: Secondary | ICD-10-CM

## 2018-01-12 ENCOUNTER — Encounter: Payer: Self-pay | Admitting: Primary Care

## 2018-01-12 ENCOUNTER — Ambulatory Visit: Payer: Commercial Managed Care - PPO | Admitting: Primary Care

## 2018-01-12 VITALS — BP 136/84 | HR 83 | Temp 98.2°F | Ht 62.5 in | Wt 142.5 lb

## 2018-01-12 DIAGNOSIS — G8929 Other chronic pain: Secondary | ICD-10-CM

## 2018-01-12 DIAGNOSIS — R945 Abnormal results of liver function studies: Secondary | ICD-10-CM

## 2018-01-12 DIAGNOSIS — I10 Essential (primary) hypertension: Secondary | ICD-10-CM

## 2018-01-12 DIAGNOSIS — Z1239 Encounter for other screening for malignant neoplasm of breast: Secondary | ICD-10-CM

## 2018-01-12 DIAGNOSIS — Z1231 Encounter for screening mammogram for malignant neoplasm of breast: Secondary | ICD-10-CM

## 2018-01-12 DIAGNOSIS — M542 Cervicalgia: Secondary | ICD-10-CM

## 2018-01-12 DIAGNOSIS — R7989 Other specified abnormal findings of blood chemistry: Secondary | ICD-10-CM

## 2018-01-12 DIAGNOSIS — Z0000 Encounter for general adult medical examination without abnormal findings: Secondary | ICD-10-CM

## 2018-01-12 DIAGNOSIS — K219 Gastro-esophageal reflux disease without esophagitis: Secondary | ICD-10-CM | POA: Diagnosis not present

## 2018-01-12 DIAGNOSIS — E785 Hyperlipidemia, unspecified: Secondary | ICD-10-CM

## 2018-01-12 MED ORDER — METOPROLOL SUCCINATE ER 50 MG PO TB24: 50.0000 mg | ORAL_TABLET | Freq: Every day | ORAL | 3 refills | Status: DC

## 2018-01-12 MED ORDER — OMEPRAZOLE 40 MG PO CPDR
40.0000 mg | DELAYED_RELEASE_CAPSULE | Freq: Every day | ORAL | 3 refills | Status: DC
Start: 2018-01-12 — End: 2019-01-27

## 2018-01-12 MED ORDER — AMLODIPINE BESYLATE 10 MG PO TABS: 10.0000 mg | ORAL_TABLET | Freq: Every day | ORAL | 3 refills | Status: DC

## 2018-01-12 NOTE — Assessment & Plan Note (Signed)
Borderline in the office today, continue current regimen. Recommended to work on diet and regular exercise. Refills sent to pharmacy.

## 2018-01-12 NOTE — Assessment & Plan Note (Signed)
Repeat LFTs pending. 

## 2018-01-12 NOTE — Assessment & Plan Note (Signed)
Doing well on omeprazole 40 mg, continue same.  

## 2018-01-12 NOTE — Progress Notes (Signed)
Subjective:    Patient ID: Christina Bell, female    DOB: 02-Mar-1966, 52 y.o.   MRN: [id number]  HPI  Christina Bell is a 52 year old female who presents today for complete physical.  BP Readings from Last 3 Encounters:  01/12/18 136/84  11/12/17 100/70  11/11/17 (!) 144/85     Immunizations: -Tetanus: Unsure. -Influenza: Declines   Diet: She endorses a healthy diet Breakfast: Tomasa BlaseBacon, toast Lunch: Fruit, vegetables, french fries Dinner: Grilled protein, salad, vegetables Snacks: Fruit, vegetables, french fries Desserts: None Beverages: Water with lemon   Exercise: Walks at work [employer] exam: Completed several years ago Dental exam: Completes semi-annually Colonoscopy: Completed in 2015 Pap Smear: Completed in 2018 Mammogram: Completed     Review of Systems  Constitutional: Negative for unexpected weight change.  HENT: Negative for rhinorrhea.   Respiratory: Negative for cough and shortness of breath.   Cardiovascular: Negative for chest pain.  Gastrointestinal: Negative for constipation and diarrhea.  Genitourinary: Negative for difficulty urinating and menstrual problem.  Musculoskeletal: Positive for neck pain. Negative for myalgias.  Skin: Negative for rash.  Allergic/Immunologic: Negative for environmental allergies.  Neurological: Positive for numbness. Negative for dizziness and headaches.       Right upper extremity numbness/tingling  Psychiatric/Behavioral: The patient is not nervous/anxious.        Past Medical History:  Diagnosis Date  . Barrett esophagus   . GERD (gastroesophageal reflux disease)   . Heart murmur   . Hypertension      Social History   Socioeconomic History  . Marital status: Married    Spouse name: Not on file  . Number of children: Not on file  . Years of education: Not on file  . Highest education level: Not on file  Occupational History  . Not on file  Social Needs  . Financial resource strain: Not on file  . Food  insecurity:    Worry: Not on file    Inability: Not on file  . Transportation needs:    Medical: Not on file    Non-medical: Not on file  Tobacco Use  . Smoking status: Current Every Day Smoker    Packs/day: 0.50    Years: 25.00    Pack years: 12.50    Types: Cigarettes  . Smokeless tobacco: Never Used  Substance and Sexual Activity  . Alcohol use: Yes    Alcohol/week: 2.0 standard drinks    Types: 2 Standard drinks or equivalent per week  . Drug use: Not on file  . Sexual activity: Not on file  Lifestyle  . Physical activity:    Days per week: Not on file    Minutes per session: Not on file  . Stress: Not on file  Relationships  . Social connections:    Talks on phone: Not on file    Gets together: Not on file    Attends religious service: Not on file    Active member of club or organization: Not on file    Attends meetings of clubs or organizations: Not on file    Relationship status: Not on file  . Intimate partner violence:    Fear of current or ex partner: Not on file    Emotionally abused: Not on file    Physically abused: Not on file    Forced sexual activity: Not on file  Other Topics Concern  . Not on file  Social History Narrative   Married.   3 children.   Worked as a  Teacher.    Enjoys waking, bicycling, driving.    Past Surgical History:  Procedure Laterality Date  . CESAREAN SECTION    . CHOLECYSTECTOMY    . ECTOPIC PREGNANCY SURGERY      Family History  Problem Relation Age of Onset  . Hypertension Mother   . Lung cancer Maternal Grandmother     Allergies  Allergen Reactions  . Morphine And Related Hives    Current Outpatient Medications on File Prior to Visit  Medication Sig Dispense Refill  . hydrochlorothiazide (HYDRODIURIL) 25 MG tablet Take 25 mg by mouth daily.  0  . predniSONE (DELTASONE) 20 MG tablet 3 tabs by mouth daily x 3 days, then 2 tabs by mouth daily x 2 days then 1 tab by mouth daily x 2 days 15 tablet 0   No current  facility-administered medications on file prior to visit.     BP 136/84   Pulse 83   Temp 98.2 F (36.8 C) (Oral)   Ht 5' 2.5" (1.588 m)   Wt 142 lb 8 oz (64.6 kg)   SpO2 98%   BMI 25.65 kg/m    Objective:   Physical Exam  Constitutional: She is oriented to person, place, and time. She appears well-nourished.  HENT:  Mouth/Throat: No oropharyngeal exudate.  Eyes: Pupils are equal, round, and reactive to light. EOM are normal.  Neck: Neck supple. No spinous process tenderness and no muscular tenderness present. No neck rigidity.  Discomfort to left neck with left rotation  Cardiovascular: Normal rate and regular rhythm.  Respiratory: Effort normal and breath sounds normal.  GI: Soft. Bowel sounds are normal. There is no tenderness.  Musculoskeletal: Normal range of motion.  Neurological: She is alert and oriented to person, place, and time.  Skin: Skin is warm and dry.  Psychiatric: She has a normal mood and affect.           Assessment & Plan:

## 2018-01-12 NOTE — Assessment & Plan Note (Signed)
Td likely due, she kindly declines today. Mammogram due, orders placed. Pap smear UTD. Colonoscopy UTD. Exam stable. Chronic neck pain which is not a new finding. Labs pending. Follow up in 1 year for CPE.

## 2018-01-12 NOTE — Assessment & Plan Note (Signed)
Repeat lipids pending. Recommended regular exercise and healthy diet.

## 2018-01-12 NOTE — Assessment & Plan Note (Signed)
Continues with right upper extremity tingling, and lower neck pain. Temporary improvement with Ibuprofen daily and massage. Plain films with osteoarthritis. She kindly declines PT due to time and cost. Referral placed to neurosurgery for further evaluation.

## 2018-01-12 NOTE — Patient Instructions (Signed)
Continue exercising. You should be getting 150 minutes of moderate intensity exercise weekly.  Increase consumption of vegetables, fruit, whole grains, lean protein.  Ensure you are consuming 64 ounces of water daily.  Call the breast center in [place] to schedule your mammogram.  You will be contacted regarding your referral to neurosurgery for your neck.  Please let us know if you have not been contacted within one week.   Schedule a lab only appointment to return for fasting labs.  It was a pleasure to see you today!   Preventive Care 40-64 Years, Female Preventive care refers to lifestyle choices and visits with your health care provider that can promote health and wellness. What does preventive care include?  A yearly physical exam. This is also called an annual well check.  Dental exams once or twice a year.  Routine eye exams. Ask your health care provider how often you should have your eyes checked.  Personal lifestyle choices, including: ? Daily care of your teeth and gums. ? Regular physical activity. ? Eating a healthy diet. ? Avoiding tobacco and drug use. ? Limiting alcohol use. ? Practicing safe sex. ? Taking low-dose aspirin daily starting at age 2. ? Taking vitamin and mineral supplements as recommended by your health care provider. What happens during an annual well check? The services and screenings done by your health care provider during your annual well check will depend on your age, overall health, lifestyle risk factors, and family history of disease. Counseling Your health care provider may ask you questions about your:  Alcohol use.  Tobacco use.  Drug use.  Emotional well-being.  Home and relationship well-being.  Sexual activity.  Eating habits.  Work and work Statistician.  Method of birth control.  Menstrual cycle.  Pregnancy history.  Screening You may have the following tests or measurements:  Height, weight, and  BMI.  Blood pressure.  Lipid and cholesterol levels. These may be checked every 5 years, or more frequently if you are over 107 years old.  Skin check.  Lung cancer screening. You may have this screening every year starting at age 54 if you have a 30-pack-year history of smoking and currently smoke or have quit within the past 15 years.  Fecal occult blood test (FOBT) of the stool. You may have this test every year starting at age 37.  Flexible sigmoidoscopy or colonoscopy. You may have a sigmoidoscopy every 5 years or a colonoscopy every 10 years starting at age 39.  Hepatitis C blood test.  Hepatitis B blood test.  Sexually transmitted disease (STD) testing.  Diabetes screening. This is done by checking your blood sugar (glucose) after you have not eaten for a while (fasting). You may have this done every 1-3 years.  Mammogram. This may be done every 1-2 years. Talk to your health care provider about when you should start having regular mammograms. This may depend on whether you have a family history of breast cancer.  BRCA-related cancer screening. This may be done if you have a family history of breast, ovarian, tubal, or peritoneal cancers.  Pelvic exam and Pap test. This may be done every 3 years starting at age 95. Starting at age 25, this may be done every 5 years if you have a Pap test in combination with an HPV test.  Bone density scan. This is done to screen for osteoporosis. You may have this scan if you are at high risk for osteoporosis.  Discuss your test results, treatment options,  and if necessary, the need for more tests with your health care provider. Vaccines Your health care provider may recommend certain vaccines, such as:  Influenza vaccine. This is recommended every year.  Tetanus, diphtheria, and acellular pertussis (Tdap, Td) vaccine. You may need a Td booster every 10 years.  Varicella vaccine. You may need this if you have not been vaccinated.  Zoster  vaccine. You may need this after age 67.  Measles, mumps, and rubella (MMR) vaccine. You may need at least one dose of MMR if you were born in 1957 or later. You may also need a second dose.  Pneumococcal 13-valent conjugate (PCV13) vaccine. You may need this if you have certain conditions and were not previously vaccinated.  Pneumococcal polysaccharide (PPSV23) vaccine. You may need one or two doses if you smoke cigarettes or if you have certain conditions.  Meningococcal vaccine. You may need this if you have certain conditions.  Hepatitis A vaccine. You may need this if you have certain conditions or if you travel or work in places where you may be exposed to hepatitis A.  Hepatitis B vaccine. You may need this if you have certain conditions or if you travel or work in places where you may be exposed to hepatitis B.  Haemophilus influenzae type b (Hib) vaccine. You may need this if you have certain conditions.  Talk to your health care provider about which screenings and vaccines you need and how often you need them. This information is not intended to replace advice given to you by your health care provider. Make sure you discuss any questions you have with your health care provider. Document Released: 05/31/2015 Document Revised: 01/22/2016 Document Reviewed: 03/05/2015 Elsevier Interactive Patient Education  [employer].

## 2018-01-14 ENCOUNTER — Other Ambulatory Visit (INDEPENDENT_AMBULATORY_CARE_PROVIDER_SITE_OTHER): Payer: Commercial Managed Care - PPO

## 2018-01-14 ENCOUNTER — Other Ambulatory Visit: Payer: Self-pay | Admitting: Family Medicine

## 2018-01-14 DIAGNOSIS — E785 Hyperlipidemia, unspecified: Secondary | ICD-10-CM

## 2018-01-14 LAB — COMPREHENSIVE METABOLIC PANEL
ALBUMIN: 4.3 g/dL (ref 3.5–5.2)
ALK PHOS: 65 U/L (ref 39–117)
ALT: 17 U/L (ref 0–35)
AST: 25 U/L (ref 0–37)
BILIRUBIN TOTAL: 0.6 mg/dL (ref 0.2–1.2)
BUN: 16 mg/dL (ref 6–23)
CO2: 26 mEq/L (ref 19–32)
CREATININE: 0.51 mg/dL (ref 0.40–1.20)
Calcium: 9.5 mg/dL (ref 8.4–10.5)
Chloride: 106 mEq/L (ref 96–112)
GFR: 133.93 mL/min (ref >60.00)
GLUCOSE: 87 mg/dL (ref 70–99)
Potassium: 4.5 mEq/L (ref 3.5–5.1)
SODIUM: 141 meq/L (ref 135–145)
TOTAL PROTEIN: 6.8 g/dL (ref 6.0–8.3)

## 2018-01-14 LAB — LIPID PANEL
Cholesterol: 205 mg/dL — ABNORMAL HIGH (ref 0–200)
HDL: 89.6 mg/dL (ref >39.00)
LDL Cholesterol: 103 mg/dL — ABNORMAL HIGH (ref 0–99)
NONHDL: 115.64
Total CHOL/HDL Ratio: 2
Triglycerides: 64 mg/dL (ref 0.0–149.0)
VLDL: 12.8 mg/dL (ref 0.0–40.0)

## 2018-01-14 NOTE — Progress Notes (Signed)
Lab orders

## 2018-01-25 ENCOUNTER — Encounter: Payer: Self-pay | Admitting: Primary Care

## 2018-03-10 ENCOUNTER — Encounter: Payer: Commercial Managed Care - PPO | Admitting: Primary Care

## 2018-03-30 ENCOUNTER — Telehealth: Payer: Self-pay | Admitting: Primary Care

## 2018-03-30 NOTE — Telephone Encounter (Signed)
Pt dropped off screening form to be filled out. Placed in RX tower.

## 2018-04-01 NOTE — Telephone Encounter (Signed)
Measurement done. Form faxed. 

## 2018-04-01 NOTE — Telephone Encounter (Signed)
This form has been completed. However, patient need to sign and waist measurement. She will stop by today and ask for me.

## 2018-10-21 ENCOUNTER — Telehealth: Payer: Self-pay | Admitting: Primary Care

## 2018-10-21 ENCOUNTER — Other Ambulatory Visit: Payer: Self-pay

## 2018-10-21 ENCOUNTER — Ambulatory Visit (INDEPENDENT_AMBULATORY_CARE_PROVIDER_SITE_OTHER): Payer: Commercial Managed Care - PPO | Admitting: Primary Care

## 2018-10-21 VITALS — BP 144/80 | HR 78 | Temp 98.2°F | Wt 143.0 lb

## 2018-10-21 DIAGNOSIS — H60502 Unspecified acute noninfective otitis externa, left ear: Secondary | ICD-10-CM

## 2018-10-21 MED ORDER — CIPROFLOXACIN-DEXAMETHASONE 0.3-0.1 % OT SUSP: 4.0000 [drp] | Freq: Two times a day (BID) | OTIC | 0 refills | Status: DC

## 2018-10-21 MED ORDER — HYDROCHLOROTHIAZIDE 25 MG PO TABS: 25.0000 mg | ORAL_TABLET | Freq: Every day | ORAL | 0 refills | Status: DC

## 2018-10-21 NOTE — Telephone Encounter (Signed)
Best number [phone] Pt did virtual appointmentwith kate

## 2018-10-21 NOTE — Assessment & Plan Note (Signed)
Evident to left canal. Rx for Ciprodex ear drops sent to pharmacy.  Discussed use of Ibuprofen PRN. Follow up PRN.

## 2018-10-21 NOTE — Progress Notes (Signed)
Subjective:    Patient ID: Christina Bell, female    DOB: [birth year], 53 y.o.   MRN: [id number]  HPI  Ms. Roopnarine is a 53 year old female with a history of GERD, hypertension, allergic conjunctivitis who presents today with a chief complaint of otalgia.  She also reports sore throat. Her ear pain is located to the left ear which she first noticed yesterday. Her pain will radiate down to her left jaw. She attempted to stick a q-tip in to her ear which caused a lot of pain. She's taken Ibuprofen without much improvement.   She denies cough, fever, recent swimming. She recently followed up with her dentist who said her teeth were doing well.   Review of Systems  Constitutional: Negative for chills and fever.  HENT: Positive for ear pain and postnasal drip. Negative for congestion, sinus pressure and sore throat.   Respiratory: Negative for cough.   Allergic/Immunologic: Positive for environmental allergies.       Past Medical History:  Diagnosis Date  . Acute neck pain 11/12/2017  . Barrett esophagus   . GERD (gastroesophageal reflux disease)   . H. pylori infection   . Heart murmur   . Hypertension      Social History   Socioeconomic History  . Marital status: Married    Spouse name: Not on file  . Number of children: Not on file  . Years of education: Not on file  . Highest education level: Not on file  Occupational History  . Not on file  Social Needs  . Financial resource strain: Not on file  . Food insecurity:    Worry: Not on file    Inability: Not on file  . Transportation needs:    Medical: Not on file    Non-medical: Not on file  Tobacco Use  . Smoking status: Current Every Day Smoker    Packs/day: 0.50    Years: 25.00    Pack years: 12.50    Types: Cigarettes  . Smokeless tobacco: Never Used  Substance and Sexual Activity  . Alcohol use: Yes    Alcohol/week: 2.0 standard drinks    Types: 2 Standard drinks or equivalent per week  . Drug use: Not on  file  . Sexual activity: Not on file  Lifestyle  . Physical activity:    Days per week: Not on file    Minutes per session: Not on file  . Stress: Not on file  Relationships  . Social connections:    Talks on phone: Not on file    Gets together: Not on file    Attends religious service: Not on file    Active member of club or organization: Not on file    Attends meetings of clubs or organizations: Not on file    Relationship status: Not on file  . Intimate partner violence:    Fear of current or ex partner: Not on file    Emotionally abused: Not on file    Physically abused: Not on file    Forced sexual activity: Not on file  Other Topics Concern  . Not on file  Social History Narrative   Married.   3 children.   Worked as a Runner, broadcasting/film/video.    Enjoys waking, bicycling, driving.    Past Surgical History:  Procedure Laterality Date  . CESAREAN SECTION    . CHOLECYSTECTOMY    . ECTOPIC PREGNANCY SURGERY      Family History  Problem Relation Age of Onset  .  Hypertension Mother   . Lung cancer Maternal Grandmother     Allergies  Allergen Reactions  . Morphine And Related Hives    Current Outpatient Medications on File Prior to Visit  Medication Sig Dispense Refill  . amLODipine (NORVASC) 10 MG tablet Take 1 tablet (10 mg total) by mouth daily. 90 tablet 3  . hydrochlorothiazide (HYDRODIURIL) 25 MG tablet Take 25 mg by mouth daily.  0  . metoprolol succinate (TOPROL-XL) 50 MG 24 hr tablet Take 1 tablet (50 mg total) by mouth daily. Take with or immediately following a meal. 90 tablet 3  . omeprazole (PRILOSEC) 40 MG capsule Take 1 capsule (40 mg total) by mouth daily. 90 capsule 3   No current facility-administered medications on file prior to visit.     BP (!) 144/80 (BP Location: Left Arm, Patient Position: Sitting, Cuff Size: Normal)   Pulse 78   Temp 98.2 F (36.8 C) (Oral)   Wt 143 lb (64.9 kg)   SpO2 97%   BMI 25.74 kg/m    Objective:   Physical Exam   Constitutional: She appears well-nourished. She does not appear ill.  HENT:  Right Ear: Tympanic membrane and ear canal normal.  Left Ear: Tympanic membrane normal. There is swelling. Tympanic membrane is not erythematous.  Nose: No mucosal edema. Right sinus exhibits no maxillary sinus tenderness and no frontal sinus tenderness. Left sinus exhibits no maxillary sinus tenderness and no frontal sinus tenderness.  Mouth/Throat: Oropharynx is clear and moist.  Moderate swelling and erythema to left canal. TM appears unremarkable from what I can tell.   Neck: Neck supple.  Cardiovascular: Normal rate and regular rhythm.  Respiratory: Effort normal and breath sounds normal. She has no wheezes.  Skin: Skin is warm and dry.           Assessment & Plan:

## 2018-10-21 NOTE — Patient Instructions (Addendum)
Instill four drops into the left ear twice daily for about one week.  You can take Ibuprofen to help with inflammation and pain.  Please call me if the drops are too expensive.  It was a pleasure to see you today!   Otitis Externa  Otitis externa is an infection of the outer ear canal. The outer ear canal is the area between the outside of the ear and the eardrum. Otitis externa is sometimes called swimmer's ear. What are the causes? Common causes of this condition include:  Swimming in dirty water.  Moisture in the ear.  An injury to the inside of the ear.  An object stuck in the ear.  A cut or scrape on the outside of the ear. What increases the risk? You are more likely to develop this condition if you go swimming often. What are the signs or symptoms? The first symptom of this condition is often itching in the ear. Later symptoms of the condition include:  Swelling of the ear.  Redness in the ear.  Ear pain. The pain may get worse when you pull on your ear.  Pus coming from the ear. How is this diagnosed? This condition may be diagnosed by examining the ear and testing fluid from the ear for bacteria and funguses. How is this treated? This condition may be treated with:  Antibiotic ear drops. These are often given for 10-14 days.  Medicines to reduce itching and swelling. Follow these instructions at home:  If you were prescribed antibiotic ear drops, use them as told by your health care provider. Do not stop using the antibiotic even if your condition improves.  Take over-the-counter and prescription medicines only as told by your health care provider.  Avoid getting water in your ears as told by your health care provider. This may include avoiding swimming or water sports for a few days.  Keep all follow-up visits as told by your health care provider. This is important. How is this prevented?  Keep your ears dry. Use the corner of a towel to dry your ears  after you swim or bathe.  Avoid scratching or putting things in your ear. Doing these things can damage the ear canal or remove the protective wax that lines it, which makes it easier for bacteria and funguses to grow.  Avoid swimming in lakes, polluted water, or pools that may not have enough chlorine. Contact a health care provider if:  You have a fever.  Your ear is still red, swollen, painful, or draining pus after 3 days.  Your redness, swelling, or pain gets worse.  You have a severe headache.  You have redness, swelling, pain, or tenderness in the area behind your ear. Summary  Otitis externa is an infection of the outer ear canal.  Common causes include swimming in dirty water, moisture in the ear, or a cut or scrape in the ear.  Symptoms include pain, redness, and swelling of the ear.  If you were prescribed antibiotic ear drops, use them as told by your health care provider. Do not stop using the antibiotic even if your condition improves. This information is not intended to replace advice given to you by your health care provider. Make sure you discuss any questions you have with your health care provider. Document Released: 05/04/2005 Document Revised: 10/08/2017 Document Reviewed: 10/08/2017 Elsevier Interactive Patient Education  2019 [employer].

## 2018-10-21 NOTE — Addendum Note (Signed)
Addended by: Tawnya Crook on: 10/21/2018 02:25 PM   Modules accepted: Orders

## 2018-12-16 ENCOUNTER — Other Ambulatory Visit: Payer: Self-pay | Admitting: Primary Care

## 2018-12-16 DIAGNOSIS — E785 Hyperlipidemia, unspecified: Secondary | ICD-10-CM

## 2018-12-16 DIAGNOSIS — I10 Essential (primary) hypertension: Secondary | ICD-10-CM

## 2018-12-22 ENCOUNTER — Other Ambulatory Visit: Payer: Self-pay

## 2018-12-22 ENCOUNTER — Other Ambulatory Visit (INDEPENDENT_AMBULATORY_CARE_PROVIDER_SITE_OTHER): Payer: Commercial Managed Care - PPO

## 2018-12-22 DIAGNOSIS — I10 Essential (primary) hypertension: Secondary | ICD-10-CM

## 2018-12-22 DIAGNOSIS — E785 Hyperlipidemia, unspecified: Secondary | ICD-10-CM

## 2018-12-22 LAB — COMPREHENSIVE METABOLIC PANEL
ALT: 28 U/L (ref 0–35)
AST: 48 U/L — ABNORMAL HIGH (ref 0–37)
Albumin: 4.5 g/dL (ref 3.5–5.2)
Alkaline Phosphatase: 67 U/L (ref 39–117)
BUN: 9 mg/dL (ref 6–23)
CO2: 26 mEq/L (ref 19–32)
Calcium: 10 mg/dL (ref 8.4–10.5)
Chloride: 104 mEq/L (ref 96–112)
Creatinine, Ser: 0.56 mg/dL (ref 0.40–1.20)
GFR: 112.72 mL/min (ref >60.00)
Glucose, Bld: 85 mg/dL (ref 70–99)
Potassium: 3.4 mEq/L — ABNORMAL LOW (ref 3.5–5.1)
Sodium: 142 mEq/L (ref 135–145)
Total Bilirubin: 0.8 mg/dL (ref 0.2–1.2)
Total Protein: 7.6 g/dL (ref 6.0–8.3)

## 2018-12-22 LAB — LIPID PANEL
Cholesterol: 223 mg/dL — ABNORMAL HIGH (ref 0–200)
HDL: 86.4 mg/dL (ref >39.00)
LDL Cholesterol: 119 mg/dL — ABNORMAL HIGH (ref 0–99)
NonHDL: 137.09
Total CHOL/HDL Ratio: 3
Triglycerides: 89 mg/dL (ref 0.0–149.0)
VLDL: 17.8 mg/dL (ref 0.0–40.0)

## 2018-12-29 ENCOUNTER — Other Ambulatory Visit: Payer: Self-pay

## 2018-12-29 ENCOUNTER — Encounter: Payer: Self-pay | Admitting: Primary Care

## 2018-12-29 ENCOUNTER — Ambulatory Visit (INDEPENDENT_AMBULATORY_CARE_PROVIDER_SITE_OTHER): Payer: Commercial Managed Care - PPO | Admitting: Primary Care

## 2018-12-29 VITALS — BP 122/80 | HR 82 | Temp 97.8°F | Ht 62.5 in | Wt 139.5 lb

## 2018-12-29 DIAGNOSIS — Z0000 Encounter for general adult medical examination without abnormal findings: Secondary | ICD-10-CM

## 2018-12-29 DIAGNOSIS — R945 Abnormal results of liver function studies: Secondary | ICD-10-CM

## 2018-12-29 DIAGNOSIS — Z23 Encounter for immunization: Secondary | ICD-10-CM | POA: Diagnosis not present

## 2018-12-29 DIAGNOSIS — Z1239 Encounter for other screening for malignant neoplasm of breast: Secondary | ICD-10-CM

## 2018-12-29 DIAGNOSIS — I10 Essential (primary) hypertension: Secondary | ICD-10-CM

## 2018-12-29 DIAGNOSIS — E785 Hyperlipidemia, unspecified: Secondary | ICD-10-CM

## 2018-12-29 DIAGNOSIS — M542 Cervicalgia: Secondary | ICD-10-CM

## 2018-12-29 DIAGNOSIS — K219 Gastro-esophageal reflux disease without esophagitis: Secondary | ICD-10-CM | POA: Diagnosis not present

## 2018-12-29 DIAGNOSIS — G8929 Other chronic pain: Secondary | ICD-10-CM

## 2018-12-29 DIAGNOSIS — R7989 Other specified abnormal findings of blood chemistry: Secondary | ICD-10-CM

## 2018-12-29 NOTE — Assessment & Plan Note (Signed)
No improvement with PT. Discussed to call neurosurgeon for follow up, may undergo injections.

## 2018-12-29 NOTE — Assessment & Plan Note (Signed)
Chronic, asymptomatic on omeprazole. Due for repeat endoscopy per patient, referral placed to GI.

## 2018-12-29 NOTE — Assessment & Plan Note (Signed)
Tetanus due, provided today. She will think about pneumonia vaccination.  Mammogram due, ordered. Pap smear due in 2021. Discussed the importance of a healthy diet and regular exercise in order for weight loss, and to reduce the risk of any potential medical problems. Exam unremarkable. Labs reviewed.

## 2018-12-29 NOTE — Progress Notes (Signed)
Subjective:    Patient ID: Christina Bell, female    DOB: [birth year], 53 y.o.   MRN: [id number]  HPI  Ms. Christina Bell is a 53 year old female who presents today for complete physical.  Immunizations: -Tetanus: Unsure, believes it's been over 10 years -Influenza: Due this season  -Pneumonia: Never completed  Diet: She is eating a healthy diet. She eats mostly bacon/eggs, lean protein, salad, vegetables. Desserts infrequently. She is drinking mostly water, alcohol.  Exercise: She is not exercising, active around her home.  Eye exam: Completed 10 years ago. Dental exam: Completes semi-annually  Colonoscopy: Completed in 2015 per patient report.  Pap Smear: Completed in 2018 Mammogram: No recent exam.  BP Readings from Last 3 Encounters:  12/29/18 122/80  10/21/18 (!) 144/80  01/12/18 136/84     Review of Systems  Constitutional: Negative for unexpected weight change.  HENT: Negative for rhinorrhea.   Respiratory: Negative for cough and shortness of breath.   Cardiovascular: Negative for chest pain.  Gastrointestinal: Negative for constipation and diarrhea.  Genitourinary: Negative for difficulty urinating.  Musculoskeletal: Positive for arthralgias and neck pain.       Chronic neck pain, following with neurosurgery. Completed PT and no improvement.   Skin: Negative for rash.  Allergic/Immunologic: Negative for environmental allergies.  Neurological: Negative for dizziness, numbness and headaches.  Psychiatric/Behavioral: The patient is not nervous/anxious.        Past Medical History:  Diagnosis Date  . Acute neck pain 11/12/2017  . Barrett esophagus   . GERD (gastroesophageal reflux disease)   . H. pylori infection   . Heart murmur   . Hypertension      Social History   Socioeconomic History  . Marital status: Married    Spouse name: Not on file  . Number of children: Not on file  . Years of education: Not on file  . Highest education level: Not on file   Occupational History  . Not on file  Social Needs  . Financial resource strain: Not on file  . Food insecurity    Worry: Not on file    Inability: Not on file  . Transportation needs    Medical: Not on file    Non-medical: Not on file  Tobacco Use  . Smoking status: Current Every Day Smoker    Packs/day: 0.50    Years: 25.00    Pack years: 12.50    Types: Cigarettes  . Smokeless tobacco: Never Used  Substance and Sexual Activity  . Alcohol use: Yes    Alcohol/week: 2.0 standard drinks    Types: 2 Standard drinks or equivalent per week  . Drug use: Not on file  . Sexual activity: Not on file  Lifestyle  . Physical activity    Days per week: Not on file    Minutes per session: Not on file  . Stress: Not on file  Relationships  . Social Musicianconnections    Talks on phone: Not on file    Gets together: Not on file    Attends religious service: Not on file    Active member of club or organization: Not on file    Attends meetings of clubs or organizations: Not on file    Relationship status: Not on file  . Intimate partner violence    Fear of current or ex partner: Not on file    Emotionally abused: Not on file    Physically abused: Not on file    Forced sexual activity: Not  on file  Other Topics Concern  . Not on file  Social History Narrative   Married.   3 children.   Worked as a Pharmacist, hospital.    Enjoys waking, bicycling, driving.    Past Surgical History:  Procedure Laterality Date  . CESAREAN SECTION    . CHOLECYSTECTOMY    . ECTOPIC PREGNANCY SURGERY      Family History  Problem Relation Age of Onset  . Hypertension Mother   . Lung cancer Maternal Grandmother     Allergies  Allergen Reactions  . Morphine And Related Hives    Current Outpatient Medications on File Prior to Visit  Medication Sig Dispense Refill  . amLODipine (NORVASC) 10 MG tablet Take 1 tablet (10 mg total) by mouth daily. 90 tablet 3  . hydrochlorothiazide (HYDRODIURIL) 25 MG tablet Take  1 tablet (25 mg total) by mouth daily. 90 tablet 0  . metoprolol succinate (TOPROL-XL) 50 MG 24 hr tablet Take 1 tablet (50 mg total) by mouth daily. Take with or immediately following a meal. 90 tablet 3  . omeprazole (PRILOSEC) 40 MG capsule Take 1 capsule (40 mg total) by mouth daily. 90 capsule 3   No current facility-administered medications on file prior to visit.     BP 122/80   Pulse 82   Temp 97.8 F (36.6 C) (Temporal)   Ht 5' 2.5" (1.588 m)   Wt 139 lb 8 oz (63.3 kg)   SpO2 98%   BMI 25.11 kg/m    Objective:   Physical Exam  Constitutional: She is oriented to person, place, and time. She appears well-nourished.  HENT:  Mouth/Throat: No oropharyngeal exudate.  Eyes: Pupils are equal, round, and reactive to light. EOM are normal.  Neck: Neck supple. No thyromegaly present.  Cardiovascular: Normal rate and regular rhythm.  Respiratory: Effort normal and breath sounds normal.  GI: Soft. Bowel sounds are normal. There is no abdominal tenderness.  Musculoskeletal:     Cervical back: She exhibits decreased range of motion and pain. She exhibits no tenderness.       Back:     Comments: Chronic decrease in ROM to cervical spine in most plans of ROM, with neck pain.  Neurological: She is alert and oriented to person, place, and time.  Skin: Skin is warm and dry.  Psychiatric: She has a normal mood and affect.           Assessment & Plan:

## 2018-12-29 NOTE — Patient Instructions (Signed)
Start exercising. You should be g Preventive Care 36-54 Years Old, Female Preventive care refers to visits with your health care provider and lifestyle choices that can promote health and wellness. This includes:  A yearly physical exam. This may also be called an annual well check.  Regular dental visits and eye exams.  Immunizations.  Screening for certain conditions.  Healthy lifestyle choices, such as eating a healthy diet, getting regular exercise, not using drugs or products that contain nicotine and tobacco, and limiting alcohol use. What can I expect for my preventive care visit? Physical exam Your health care provider will check your:  Height and weight. This may be used to calculate body mass index (BMI), which tells if you are at a healthy weight.  Heart rate and blood pressure.  Skin for abnormal spots. Counseling Your health care provider may ask you questions about your:  Alcohol, tobacco, and drug use.  Emotional well-being.  Home and relationship well-being.  Sexual activity.  Eating habits.  Work and work Statistician.  Method of birth control.  Menstrual cycle.  Pregnancy history. What immunizations do I need?  Influenza (flu) vaccine  This is recommended every year. Tetanus, diphtheria, and pertussis (Tdap) vaccine  You may need a Td booster every 10 years. Varicella (chickenpox) vaccine  You may need this if you have not been vaccinated. Zoster (shingles) vaccine  You may need this after age 33. Measles, mumps, and rubella (MMR) vaccine  You may need at least one dose of MMR if you were born in 1957 or later. You may also need a second dose. Pneumococcal conjugate (PCV13) vaccine  You may need this if you have certain conditions and were not previously vaccinated. Pneumococcal polysaccharide (PPSV23) vaccine  You may need one or two doses if you smoke cigarettes or if you have certain conditions. Meningococcal conjugate (MenACWY)  vaccine  You may need this if you have certain conditions. Hepatitis A vaccine  You may need this if you have certain conditions or if you travel or work in places where you may be exposed to hepatitis A. Hepatitis B vaccine  You may need this if you have certain conditions or if you travel or work in places where you may be exposed to hepatitis B. Haemophilus influenzae type b (Hib) vaccine  You may need this if you have certain conditions. Human papillomavirus (HPV) vaccine  If recommended by your health care provider, you may need three doses over 6 months. You may receive vaccines as individual doses or as more than one vaccine together in one shot (combination vaccines). Talk with your health care provider about the risks and benefits of combination vaccines. What tests do I need? Blood tests  Lipid and cholesterol levels. These may be checked every 5 years, or more frequently if you are over 55 years old.  Hepatitis C test.  Hepatitis B test. Screening  Lung cancer screening. You may have this screening every year starting at age 62 if you have a 30-pack-year history of smoking and currently smoke or have quit within the past 15 years.  Colorectal cancer screening. All adults should have this screening starting at age 69 and continuing until age 52. Your health care provider may recommend screening at age 27 if you are at increased risk. You will have tests every 1-10 years, depending on your results and the type of screening test.  Diabetes screening. This is done by checking your blood sugar (glucose) after you have not eaten for a  while (fasting). You may have this done every 1-3 years.  Mammogram. This may be done every 1-2 years. Talk with your health care provider about when you should start having regular mammograms. This may depend on whether you have a family history of breast cancer.  BRCA-related cancer screening. This may be done if you have a family history of  breast, ovarian, tubal, or peritoneal cancers.  Pelvic exam and Pap test. This may be done every 3 years starting at age 63. Starting at age 37, this may be done every 5 years if you have a Pap test in combination with an HPV test. Other tests  Sexually transmitted disease (STD) testing.  Bone density scan. This is done to screen for osteoporosis. You may have this scan if you are at high risk for osteoporosis. Follow these instructions at home: Eating and drinking  Eat a diet that includes fresh fruits and vegetables, whole grains, lean protein, and low-fat dairy.  Take vitamin and mineral supplements as recommended by your health care provider.  Do not drink alcohol if: ? Your health care provider tells you not to drink. ? You are pregnant, may be pregnant, or are planning to become pregnant.  If you drink alcohol: ? Limit how much you have to 0-1 drink a day. ? Be aware of how much alcohol is in your drink. In the [place] one drink equals one 12 oz bottle of beer (355 mL), one 5 oz glass of wine (148 mL), or one 1 oz glass of hard liquor (44 mL). Lifestyle  Take daily care of your teeth and gums.  Stay active. Exercise for at least 30 minutes on 5 or more days each week.  Do not use any products that contain nicotine or tobacco, such as cigarettes, e-cigarettes, and chewing tobacco. If you need help quitting, ask your health care provider.  If you are sexually active, practice safe sex. Use a condom or other form of birth control (contraception) in order to prevent pregnancy and STIs (sexually transmitted infections).  If told by your health care provider, take low-dose aspirin daily starting at age 22. What's next?  Visit your health care provider once a year for a well check visit.  Ask your health care provider how often you should have your eyes and teeth checked.  Stay up to date on all vaccines. This information is not intended to replace advice given to you by your  health care provider. Make sure you discuss any questions you have with your health care provider. Document Released: 05/31/2015 Document Revised: 01/13/2018 Document Reviewed: 01/13/2018 Elsevier Patient Education  [place]. etting 150 minutes of moderate intensity exercise weekly.  It's important to improve your diet by reducing consumption of fast food, fried food, processed snack foods, sugary drinks. Increase consumption of fresh vegetables and fruits, whole grains, water.  Ensure you are drinking 64 ounces of water daily.  Call the neurosurgeon as discussed.  You will be contacted regarding your referral to GI for the endoscopy.  Please let us know if you have not been contacted within one week.   Schedule a lab only appointment for 4 months to repeat your cholesterol and liver function test.  It was a pleasure to see you today!

## 2018-12-29 NOTE — Addendum Note (Signed)
Addended by: Jacqualin Combes on: 12/29/2018 11:18 AM   Modules accepted: Orders

## 2018-12-29 NOTE — Assessment & Plan Note (Signed)
Borderline. Strongly advised regular exercise and healthy diet. Repeat in 4 months.

## 2018-12-29 NOTE — Assessment & Plan Note (Signed)
Stable in the office today, continue current regimen.  BMP reviewed. 

## 2018-12-29 NOTE — Assessment & Plan Note (Addendum)
Slight elevation in AST, likely fat build up but does consume alcohol often. Discussed to work on dietary changes, exercise. Continue to monitor. Repeat in 4 months.

## 2019-01-06 ENCOUNTER — Telehealth: Payer: Self-pay | Admitting: Primary Care

## 2019-01-06 DIAGNOSIS — H9209 Otalgia, unspecified ear: Secondary | ICD-10-CM

## 2019-01-06 MED ORDER — CIPRODEX 0.3-0.1 % OT SUSP: OTIC | 0 refills | Status: DC

## 2019-01-06 NOTE — Telephone Encounter (Signed)
Patient's Husband stated he was returning a call from our office. He sent a mychart message over on his account about his wife.  Mychart message below:   Hello Dailey, Alberson and I are in [place] and her ear starting hurting after being in ocean.... We believe it may be an infection.Marland KitchenMarland KitchenHowever we will defer to you.Marland Kitchen.:) Can you prescribe an ear drop antibiotic or equivalent.   [facility] [place] [id number] [phone]  Otherwise we are having a blast social distancing, fishing and snorkeling..... Thanks

## 2019-01-06 NOTE — Telephone Encounter (Signed)
Noted. My chart message reviewed and responded.

## 2019-01-13 ENCOUNTER — Other Ambulatory Visit: Payer: Self-pay | Admitting: Primary Care

## 2019-01-27 ENCOUNTER — Other Ambulatory Visit: Payer: Self-pay | Admitting: Primary Care

## 2019-01-27 DIAGNOSIS — K219 Gastro-esophageal reflux disease without esophagitis: Secondary | ICD-10-CM

## 2019-01-27 DIAGNOSIS — I10 Essential (primary) hypertension: Secondary | ICD-10-CM

## 2019-01-31 ENCOUNTER — Other Ambulatory Visit: Payer: Self-pay

## 2019-01-31 ENCOUNTER — Ambulatory Visit: Payer: Commercial Managed Care - PPO | Admitting: Gastroenterology

## 2019-01-31 ENCOUNTER — Encounter: Payer: Self-pay | Admitting: Gastroenterology

## 2019-01-31 VITALS — BP 152/84 | HR 87 | Temp 98.3°F | Ht 62.0 in | Wt 142.0 lb

## 2019-01-31 DIAGNOSIS — K219 Gastro-esophageal reflux disease without esophagitis: Secondary | ICD-10-CM | POA: Diagnosis not present

## 2019-01-31 NOTE — Progress Notes (Signed)
Christina Darby, MD [address]  [place]  [place], [place] [address]  Main: [phone]  Fax: [phone]    Gastroenterology Consultation  Referring Provider:     Pleas Koch, NP Primary Care Physician:  Pleas Koch, NP Primary Gastroenterologist:  Dr. Cephas Bell Reason for Consultation:     History of Barrett's esophagus        HPI:   Christina Bell is a 53 y.o. female referred by Dr. Carlis Abbott, Leticia Penna, NP  for consultation & management of history of Barrett's esophagus.  Patient is here to discuss about surveillance EGD.  Patient moved from [place], [place] 2 years ago to [place] for her husband's job.  Patient has been undergoing serial endoscopies since 2003 in [place], [place] for Barrett's surveillance.  Reviewing all her endoscopy reports, she had only small island of mucosa at the GE junction and there was reported findings of LA grade a esophagitis/ulcer which healed on subsequent EGDs.  Her latest EGD was in 2015 which did not reveal evidence of Barrett's.  Prior to this she had EGD in 2012, 2011, GE junction biopsies did not reveal intestinal metaplasia.  Patient underwent 8 upper endoscopies in 11/2001.  She was started on long-term omeprazole 40 mg daily.  She does experience rebound reflux symptoms whenever she is off omeprazole and has to take over-the-counter PPI.  However, she denies any GERD symptoms even before she was on PPI.  She was told that she has silent acid reflux as all her teeth were damaged and lost enamel and her teeth had to be replaced. She also underwent gastric and duodenal biopsies which were negative.  She had a screening colonoscopy on 05/18/2013 which was unremarkable  Patient is also found to have mildly elevated transaminases, AST 60, ALT 42 which normalized in 2019.  Her most recent AST was 48 in 12/2018.  She acknowledges drinking 1 capful of vodka every night  NSAIDs: None  Antiplts/Anticoagulants/Anti  thrombotics: None  GI Procedures: Refer to her procedures tab, reviewed  Past Medical History:  Diagnosis Date  . Acute neck pain 11/12/2017  . Barrett esophagus   . GERD (gastroesophageal reflux disease)   . H. pylori infection   . Heart murmur   . Hypertension     Past Surgical History:  Procedure Laterality Date  . CESAREAN SECTION    . CHOLECYSTECTOMY    . ECTOPIC PREGNANCY SURGERY     Current Outpatient Medications:  .  amLODipine (NORVASC) 10 MG tablet, TAKE 1 TABLET BY MOUTH EVERY DAY, Disp: 90 tablet, Rfl: 3 .  hydrochlorothiazide (HYDRODIURIL) 25 MG tablet, TAKE 1 TABLET BY MOUTH EVERY DAY, Disp: 90 tablet, Rfl: 1 .  metoprolol succinate (TOPROL-XL) 50 MG 24 hr tablet, Take 1 tablet (50 mg total) by mouth daily. Take with or immediately following a meal., Disp: 90 tablet, Rfl: 3 .  omeprazole (PRILOSEC) 40 MG capsule, TAKE 1 CAPSULE BY MOUTH EVERY DAY, Disp: 90 capsule, Rfl: 0   Family History  Problem Relation Age of Onset  . Hypertension Mother   . Lung cancer Maternal Grandmother      Social History   Tobacco Use  . Smoking status: Current Every Day Smoker    Packs/day: 0.50    Years: 25.00    Pack years: 12.50    Types: Cigarettes  . Smokeless tobacco: Never Used  Substance Use Topics  . Alcohol use: Yes    Alcohol/week: 2.0 standard drinks  Types: 2 Standard drinks or equivalent per week  . Drug use: Not on file    Allergies as of 01/31/2019 - Review Complete 01/31/2019  Allergen Reaction Noted  . Morphine and related Hives 06/12/2016    Review of Systems:    All systems reviewed and negative except where noted in HPI.   Physical Exam:  BP (!) 152/84   Pulse 87   Temp 98.3 F (36.8 C)   Ht 5\' 2"  (1.575 m)   Wt 142 lb (64.4 kg)   BMI 25.97 kg/m  No LMP recorded. Patient has had an ablation.  General:   Alert,  Well-developed, well-nourished, pleasant and cooperative in NAD Head:  Normocephalic and atraumatic. Eyes:  Sclera clear, no  icterus.   Conjunctiva pink. Ears:  Normal auditory acuity. Nose:  No deformity, discharge, or lesions. Mouth:  No deformity or lesions,oropharynx pink & moist. Neck:  Supple; no masses or thyromegaly. Lungs:  Respirations even and unlabored.  Clear throughout to auscultation.   No wheezes, crackles, or rhonchi. No acute distress. Heart:  Regular rate and rhythm; no murmurs, clicks, rubs, or gallops. Abdomen:  Normal bowel sounds. Soft, non-tender and non-distended without masses, hepatosplenomegaly or hernias noted.  No guarding or rebound tenderness.   Rectal: Not performed Msk:  Symmetrical without gross deformities. Good, equal movement & strength bilaterally. Pulses:  Normal pulses noted. Extremities:  No clubbing or edema.  No cyanosis. Neurologic:  Alert and oriented x3;  grossly normal neurologically. Skin:  Intact without significant lesions or rashes. No jaundice. Psych:  Alert and cooperative. Normal mood and affect.  Imaging Studies: None  Assessment and Plan:   Laverda PageHeather Liuzzi is a 53 y.o. female with history of chronic tobacco use seen in consultation to discuss about upper endoscopy for Barrett's surveillance.  After reviewing her previous upper endoscopy reports including pathology in detail, it appears that patient had only a tiny [place] of possible intestinal metaplasia if any.  Therefore, according to the ACG guidelines of Barrett's esophagus, recommendation is not to do surveillance endoscopy or take any biopsies Recommend to wean off omeprazole and switch to PPI such as Pepcid if needed  Elevated transaminases: Most likely secondary to fatty liver Recommend right upper quadrant ultrasound Recheck LFTs in 3 months, if persistently elevated, recommend secondary liver disease work-up   Follow up as needed   Arlyss Repressohini R , MD

## 2019-03-18 ENCOUNTER — Other Ambulatory Visit: Payer: Self-pay | Admitting: Primary Care

## 2019-03-18 DIAGNOSIS — I10 Essential (primary) hypertension: Secondary | ICD-10-CM

## 2019-03-23 ENCOUNTER — Ambulatory Visit
Admission: RE | Admit: 2019-03-23 | Discharge: 2019-03-23 | Disposition: A | Discharge status: Home / Self Care | Payer: Commercial Managed Care - PPO | Source: Ambulatory Visit | Attending: Primary Care | Admitting: Primary Care

## 2019-03-23 DIAGNOSIS — Z1231 Encounter for screening mammogram for malignant neoplasm of breast: Secondary | ICD-10-CM | POA: Diagnosis not present

## 2019-03-23 DIAGNOSIS — R921 Mammographic calcification found on diagnostic imaging of breast: Secondary | ICD-10-CM | POA: Diagnosis not present

## 2019-03-23 DIAGNOSIS — Z1239 Encounter for other screening for malignant neoplasm of breast: Secondary | ICD-10-CM

## 2019-03-27 ENCOUNTER — Other Ambulatory Visit: Payer: Self-pay | Admitting: Primary Care

## 2019-03-27 DIAGNOSIS — N6310 Unspecified lump in the right breast, unspecified quadrant: Secondary | ICD-10-CM

## 2019-03-27 DIAGNOSIS — R921 Mammographic calcification found on diagnostic imaging of breast: Secondary | ICD-10-CM

## 2019-03-27 DIAGNOSIS — N6320 Unspecified lump in the left breast, unspecified quadrant: Secondary | ICD-10-CM

## 2019-03-27 DIAGNOSIS — R928 Other abnormal and inconclusive findings on diagnostic imaging of breast: Secondary | ICD-10-CM

## 2019-03-31 ENCOUNTER — Ambulatory Visit
Admission: RE | Admit: 2019-03-31 | Discharge: 2019-03-31 | Disposition: A | Discharge status: Home / Self Care | Payer: Commercial Managed Care - PPO | Source: Ambulatory Visit | Attending: Primary Care | Admitting: Primary Care

## 2019-03-31 DIAGNOSIS — R928 Other abnormal and inconclusive findings on diagnostic imaging of breast: Secondary | ICD-10-CM

## 2019-03-31 DIAGNOSIS — N6320 Unspecified lump in the left breast, unspecified quadrant: Secondary | ICD-10-CM

## 2019-03-31 DIAGNOSIS — N6310 Unspecified lump in the right breast, unspecified quadrant: Secondary | ICD-10-CM

## 2019-03-31 DIAGNOSIS — R921 Mammographic calcification found on diagnostic imaging of breast: Secondary | ICD-10-CM

## 2019-04-18 ENCOUNTER — Telehealth: Payer: Self-pay

## 2019-04-18 NOTE — Telephone Encounter (Signed)
Noted. Please notify patient that I reviewed the breast ultrasounds and agree to repeat imaging in 6 months. I agree that they are likely benign but we like to monitor for changes due to the calcifications. I really don't have any additional information. If she still wants to meet that's fine, she can also communicate with me via my chart. Otherwise you can cancel appointment if she doesn't wish to use an appointment slot to discuss.

## 2019-04-18 NOTE — Telephone Encounter (Signed)
Spoken and notified patient of Christina Bell comments. Patient verbalized understanding. Patient would like to cancel the appointment.

## 2019-04-18 NOTE — Telephone Encounter (Signed)
Pt concerned about results of last 2 mammograms and [name]. Pt was advised probably benign but pt wants to discuss with Christina Fitz NP. Pt last appt 12/29/18 for annual exam. Pt scheduled virtual appt with Christina Fitz NP on 04/19/19 at 11:20; pt will have wt and temp available when CMA calls.

## 2019-04-19 ENCOUNTER — Ambulatory Visit: Payer: Commercial Managed Care - PPO | Admitting: Primary Care

## 2019-04-20 ENCOUNTER — Telehealth: Payer: Self-pay | Admitting: Primary Care

## 2019-04-20 ENCOUNTER — Other Ambulatory Visit: Payer: Self-pay | Admitting: Primary Care

## 2019-04-20 DIAGNOSIS — N6310 Unspecified lump in the right breast, unspecified quadrant: Secondary | ICD-10-CM

## 2019-04-20 NOTE — Telephone Encounter (Signed)
Patient's husband communicating with me via My Chart under his name Drusilla Wampole) reporting patient's ongoing concerns regarding her recent diagnostic mammogram and [name] reports. Patient wanting to proceed with biopsy of both breasts to rule out malignancy. Orders placed.

## 2019-04-24 ENCOUNTER — Other Ambulatory Visit: Payer: Self-pay | Admitting: Primary Care

## 2019-04-24 DIAGNOSIS — R928 Other abnormal and inconclusive findings on diagnostic imaging of breast: Secondary | ICD-10-CM

## 2019-04-24 DIAGNOSIS — N6310 Unspecified lump in the right breast, unspecified quadrant: Secondary | ICD-10-CM

## 2019-04-24 DIAGNOSIS — N6320 Unspecified lump in the left breast, unspecified quadrant: Secondary | ICD-10-CM

## 2019-05-02 ENCOUNTER — Ambulatory Visit
Admission: RE | Admit: 2019-05-02 | Discharge: 2019-05-02 | Disposition: A | Discharge status: Home / Self Care | Payer: Commercial Managed Care - PPO | Source: Ambulatory Visit | Attending: Primary Care | Admitting: Primary Care

## 2019-05-02 DIAGNOSIS — N6310 Unspecified lump in the right breast, unspecified quadrant: Secondary | ICD-10-CM

## 2019-05-02 DIAGNOSIS — Z1231 Encounter for screening mammogram for malignant neoplasm of breast: Secondary | ICD-10-CM | POA: Diagnosis not present

## 2019-05-02 DIAGNOSIS — R928 Other abnormal and inconclusive findings on diagnostic imaging of breast: Secondary | ICD-10-CM | POA: Diagnosis not present

## 2019-05-02 DIAGNOSIS — N6320 Unspecified lump in the left breast, unspecified quadrant: Secondary | ICD-10-CM | POA: Insufficient documentation

## 2019-05-02 HISTORY — PX: BREAST BIOPSY: SHX20

## 2019-05-03 LAB — SURGICAL PATHOLOGY

## 2019-05-05 ENCOUNTER — Other Ambulatory Visit: Payer: Self-pay | Admitting: Primary Care

## 2019-05-05 DIAGNOSIS — K219 Gastro-esophageal reflux disease without esophagitis: Secondary | ICD-10-CM

## 2019-05-05 NOTE — Telephone Encounter (Signed)
Last prescribed on 01/27/2019 . Last appointment on 12/29/2018. No future appointment

## 2019-05-05 NOTE — Telephone Encounter (Signed)
Refill sent to pharmacy.   

## 2019-05-17 ENCOUNTER — Ambulatory Visit: Payer: Commercial Managed Care - PPO | Attending: Internal Medicine

## 2019-05-17 DIAGNOSIS — Z20822 Contact with and (suspected) exposure to covid-19: Secondary | ICD-10-CM

## 2019-05-18 LAB — NOVEL CORONAVIRUS, NAA: SARS-CoV-2, NAA: NOT DETECTED

## 2019-07-13 ENCOUNTER — Other Ambulatory Visit: Payer: Self-pay | Admitting: Primary Care

## 2019-09-26 ENCOUNTER — Encounter: Payer: Self-pay | Admitting: Family Medicine

## 2019-09-26 ENCOUNTER — Other Ambulatory Visit: Payer: Self-pay

## 2019-09-26 ENCOUNTER — Ambulatory Visit: Payer: Commercial Managed Care - PPO | Admitting: Family Medicine

## 2019-09-26 VITALS — BP 140/80 | HR 90 | Temp 97.8°F | Ht 62.0 in | Wt 144.1 lb

## 2019-09-26 DIAGNOSIS — R300 Dysuria: Secondary | ICD-10-CM

## 2019-09-26 DIAGNOSIS — N390 Urinary tract infection, site not specified: Secondary | ICD-10-CM

## 2019-09-26 DIAGNOSIS — N3001 Acute cystitis with hematuria: Secondary | ICD-10-CM

## 2019-09-26 DIAGNOSIS — N3000 Acute cystitis without hematuria: Secondary | ICD-10-CM | POA: Insufficient documentation

## 2019-09-26 LAB — POC URINALSYSI DIPSTICK (AUTOMATED)
Bilirubin, UA: NEGATIVE
Blood, UA: 200
Glucose, UA: NEGATIVE
Ketones, UA: 5
Nitrite, UA: NEGATIVE
Protein, UA: POSITIVE — AB
Spec Grav, UA: 1.015 (ref 1.010–1.025)
Urobilinogen, UA: 0.2 E.U./dL
pH, UA: 6 (ref 5.0–8.0)

## 2019-09-26 MED ORDER — SULFAMETHOXAZOLE-TRIMETHOPRIM 800-160 MG PO TABS: 1.0000 | ORAL_TABLET | Freq: Two times a day (BID) | ORAL | 0 refills | Status: DC

## 2019-09-26 NOTE — Patient Instructions (Addendum)
Drink lots of water  Take the bactrim as directed  If symptoms suddenly worsen-please let us know   When the urine culture returns - we will notify you and see how you are doing     Urinary Tract Infection, Adult  A urinary tract infection (UTI) is an infection of any part of the urinary tract. The urinary tract includes the kidneys, ureters, bladder, and urethra. These organs make, store, and get rid of urine in the body. Your health care provider may use other names to describe the infection. An upper UTI affects the ureters and kidneys (pyelonephritis). A lower UTI affects the bladder (cystitis) and urethra (urethritis). What are the causes? Most urinary tract infections are caused by bacteria in your genital area, around the entrance to your urinary tract (urethra). These bacteria grow and cause inflammation of your urinary tract. What increases the risk? You are more likely to develop this condition if:  You have a urinary catheter that stays in place (indwelling).  You are not able to control when you urinate or have a bowel movement (you have incontinence).  You are female and you: ? Use a spermicide or diaphragm for birth control. ? Have low estrogen levels. ? Are pregnant.  You have certain genes that increase your risk (genetics).  You are sexually active.  You take antibiotic medicines.  You have a condition that causes your flow of urine to slow down, such as: ? An enlarged prostate, if you are female. ? Blockage in your urethra (stricture). ? A kidney stone. ? A nerve condition that affects your bladder control (neurogenic bladder). ? Not getting enough to drink, or not urinating often.  You have certain medical conditions, such as: ? Diabetes. ? A weak disease-fighting system (immunesystem). ? Sickle cell disease. ? Gout. ? Spinal cord injury. What are the signs or symptoms? Symptoms of this condition include:  Needing to urinate right away  (urgently).  Frequent urination or passing small amounts of urine frequently.  Pain or burning with urination.  Blood in the urine.  Urine that smells bad or unusual.  Trouble urinating.  Cloudy urine.  Vaginal discharge, if you are female.  Pain in the abdomen or the lower back. You may also have:  Vomiting or a decreased appetite.  Confusion.  Irritability or tiredness.  A fever.  Diarrhea. The first symptom in older adults may be confusion. In some cases, they may not have any symptoms until the infection has worsened. How is this diagnosed? This condition is diagnosed based on your medical history and a physical exam. You may also have other tests, including:  Urine tests.  Blood tests.  Tests for sexually transmitted infections (STIs). If you have had more than one UTI, a cystoscopy or imaging studies may be done to determine the cause of the infections. How is this treated? Treatment for this condition includes:  Antibiotic medicine.  Over-the-counter medicines to treat discomfort.  Drinking enough water to stay hydrated. If you have frequent infections or have other conditions such as a kidney stone, you may need to see a health care provider who specializes in the urinary tract (urologist). In rare cases, urinary tract infections can cause sepsis. Sepsis is a life-threatening condition that occurs when the body responds to an infection. Sepsis is treated in the hospital with IV antibiotics, fluids, and other medicines. Follow these instructions at home:  Medicines  Take over-the-counter and prescription medicines only as told by your health care provider.  If  you were prescribed an antibiotic medicine, take it as told by your health care provider. Do not stop using the antibiotic even if you start to feel better. General instructions  Make sure you: ? Empty your bladder often and completely. Do not hold urine for long periods of time. ? Empty your  bladder after sex. ? Wipe from front to back after a bowel movement if you are female. Use each tissue one time when you wipe.  Drink enough fluid to keep your urine pale yellow.  Keep all follow-up visits as told by your health care provider. This is important. Contact a health care provider if:  Your symptoms do not get better after 1-2 days.  Your symptoms go away and then return. Get help right away if you have:  Severe pain in your back or your lower abdomen.  A fever.  Nausea or vomiting. Summary  A urinary tract infection (UTI) is an infection of any part of the urinary tract, which includes the kidneys, ureters, bladder, and urethra.  Most urinary tract infections are caused by bacteria in your genital area, around the entrance to your urinary tract (urethra).  Treatment for this condition often includes antibiotic medicines.  If you were prescribed an antibiotic medicine, take it as told by your health care provider. Do not stop using the antibiotic even if you start to feel better.  Keep all follow-up visits as told by your health care provider. This is important. This information is not intended to replace advice given to you by your health care provider. Make sure you discuss any questions you have with your health care provider. Document Revised: 04/21/2018 Document Reviewed: 11/11/2017 Elsevier Patient Education  2020 [employer].

## 2019-09-26 NOTE — Assessment & Plan Note (Signed)
With hematuria  S/p travel and also baths  Disc ways to prevent utis  Px bactrim DS Drink fluids inst to call if symptoms worsen cx pending-will change tx if needed  Handout given re: utis

## 2019-09-26 NOTE — Progress Notes (Signed)
Subjective:    Patient ID: Christina Bell, female    DOB: Mar 14, 1966, 54 y.o.   MRN: [id number]  This visit occurred during the SARS-CoV-2 public health emergency.  Safety protocols were in place, including screening questions prior to the visit, additional usage of staff PPE, and extensive cleaning of exam room while observing appropriate contact time as indicated for disinfecting solutions.    HPI 54 yo pt of NP Clark presents with uti symptoms   Just traveled  [place] [employer] -recently   Symptoms started yesterday Low back pain  Then dysuria -quite bad Frequency and urgency  Then blood in urine   No flank pain   No fever  No nausea   She is drinking lots of water    Ua: Results for orders placed or performed in visit on 09/26/19  POCT Urinalysis Dipstick (Automated)  Result Value Ref Range   Color, UA Light Brown    Clarity, UA Cloudy    Glucose, UA Negative Negative   Bilirubin, UA Negative    Ketones, UA 5 mg/dL    Spec Grav, UA 1.015 1.010 - 1.025   Blood, UA 200 Ery/uL    pH, UA 6.0 5.0 - 8.0   Protein, UA Positive (A) Negative   Urobilinogen, UA 0.2 0.2 or 1.0 E.U./dL   Nitrite, UA Negative    Leukocytes, UA Moderate (2+) (A) Negative    She is a current smoker - 1/2 pack per day  Not ready to quit   Recurrent uti is listed in chart -that was back in 2018   Patient Active Problem List   Diagnosis Date Noted  . Acute cystitis 09/26/2019  . Chronic neck pain 11/12/2017  . Hyperlipidemia 12/07/2016  . Elevated LFTs 12/07/2016  . Preventative health care 12/07/2016  . Recurrent UTI 08/28/2016  . Essential hypertension 06/30/2016  . Gastroesophageal reflux disease 06/30/2016   Past Medical History:  Diagnosis Date  . Acute neck pain 11/12/2017  . Barrett esophagus   . GERD (gastroesophageal reflux disease)   . H. pylori infection   . Heart murmur   . Hypertension    Past Surgical History:  Procedure Laterality Date  . BREAST BIOPSY Right    duct removed- neg  . BREAST BIOPSY Left 05/02/2019   [name] bx/ribbon clip  . BREAST BIOPSY Right 05/02/2019   [name] bx/ coil clip  . CESAREAN SECTION    . CHOLECYSTECTOMY    . ECTOPIC PREGNANCY SURGERY     Social History   Tobacco Use  . Smoking status: Current Every Day Smoker    Packs/day: 0.50    Years: 25.00    Pack years: 12.50    Types: Cigarettes  . Smokeless tobacco: Never Used  Substance Use Topics  . Alcohol use: Yes    Alcohol/week: 2.0 standard drinks    Types: 2 Standard drinks or equivalent per week  . Drug use: Not on file   Family History  Problem Relation Age of Onset  . Hypertension Mother   . Lung cancer Maternal Grandmother   . Breast cancer Neg Hx    Allergies  Allergen Reactions  . Morphine And Related Hives   Current Outpatient Medications on File Prior to Visit  Medication Sig Dispense Refill  . amLODipine (NORVASC) 10 MG tablet TAKE 1 TABLET BY MOUTH EVERY DAY 90 tablet 3  . hydrochlorothiazide (HYDRODIURIL) 25 MG tablet TAKE 1 TABLET BY MOUTH EVERY DAY 90 tablet 0  . metoprolol succinate (TOPROL-XL) 50 MG 24 hr  tablet TAKE 1 TABLET (50 MG TOTAL) BY MOUTH DAILY. TAKE WITH OR IMMEDIATELY FOLLOWING A MEAL. 90 tablet 2  . omeprazole (PRILOSEC) 40 MG capsule Take 1 capsule (40 mg total) by mouth daily. For heartburn 90 capsule 2   No current facility-administered medications on file prior to visit.    Review of Systems  Constitutional: Positive for fatigue. Negative for activity change, appetite change and fever.  HENT: Negative for congestion and sore throat.   Eyes: Negative for itching and visual disturbance.  Respiratory: Negative for cough and shortness of breath.   Cardiovascular: Negative for leg swelling.  Gastrointestinal: Negative for abdominal distention, abdominal pain, constipation, diarrhea and nausea.  Endocrine: Negative for cold intolerance and polydipsia.  Genitourinary: Positive for dysuria, frequency, hematuria and urgency.  Negative for difficulty urinating, flank pain, pelvic pain and vaginal discharge.  Musculoskeletal: Negative for myalgias.  Skin: Negative for rash.  Allergic/Immunologic: Negative for immunocompromised state.  Neurological: Negative for dizziness and weakness.  Hematological: Negative for adenopathy.       Objective:   Physical Exam Constitutional:      General: She is not in acute distress.    Appearance: She is well-developed and normal weight. She is not ill-appearing.  HENT:     Head: Normocephalic and atraumatic.  Eyes:     General: No scleral icterus.    Conjunctiva/sclera: Conjunctivae normal.     Pupils: Pupils are equal, round, and reactive to light.  Cardiovascular:     Rate and Rhythm: Normal rate and regular rhythm.     Heart sounds: Normal heart sounds.  Pulmonary:     Effort: Pulmonary effort is normal.     Breath sounds: Normal breath sounds.  Abdominal:     General: Bowel sounds are normal. There is no distension.     Palpations: Abdomen is soft.     Tenderness: There is abdominal tenderness. There is no rebound.     Comments: No cva tenderness  Mild suprapubic tenderness  Musculoskeletal:     Cervical back: Normal range of motion and neck supple.  Lymphadenopathy:     Cervical: No cervical adenopathy.  Skin:    General: Skin is warm and dry.     Findings: No rash.  Neurological:     Mental Status: She is alert.  Psychiatric:        Mood and Affect: Mood normal.           Assessment & Plan:   Problem List Items Addressed This Visit      Genitourinary   Recurrent UTI   Relevant Medications   sulfamethoxazole-trimethoprim (BACTRIM DS) 800-160 MG tablet   Acute cystitis - Primary    With hematuria  S/p travel and also baths  Disc ways to prevent utis  Px bactrim DS Drink fluids inst to call if symptoms worsen cx pending-will change tx if needed  Handout given re: utis       Relevant Orders   Urine Culture    Other Visit Diagnoses     Dysuria       Relevant Orders   POCT Urinalysis Dipstick (Automated) (Completed)

## 2019-09-28 LAB — URINE CULTURE
MICRO NUMBER:: 10463886
SPECIMEN QUALITY:: ADEQUATE

## 2019-12-09 ENCOUNTER — Other Ambulatory Visit: Payer: Self-pay | Admitting: Primary Care

## 2019-12-09 DIAGNOSIS — I10 Essential (primary) hypertension: Secondary | ICD-10-CM

## 2020-01-18 ENCOUNTER — Other Ambulatory Visit: Payer: Self-pay | Admitting: Primary Care

## 2020-01-18 DIAGNOSIS — I10 Essential (primary) hypertension: Secondary | ICD-10-CM

## 2020-01-18 DIAGNOSIS — Z1159 Encounter for screening for other viral diseases: Secondary | ICD-10-CM

## 2020-01-18 DIAGNOSIS — E785 Hyperlipidemia, unspecified: Secondary | ICD-10-CM

## 2020-01-18 DIAGNOSIS — Z114 Encounter for screening for human immunodeficiency virus [HIV]: Secondary | ICD-10-CM

## 2020-01-23 ENCOUNTER — Other Ambulatory Visit: Payer: Commercial Managed Care - PPO

## 2020-01-25 ENCOUNTER — Ambulatory Visit: Payer: Commercial Managed Care - PPO | Admitting: Primary Care

## 2020-02-17 ENCOUNTER — Other Ambulatory Visit: Payer: Self-pay | Admitting: Primary Care

## 2020-02-17 DIAGNOSIS — K219 Gastro-esophageal reflux disease without esophagitis: Secondary | ICD-10-CM

## 2020-03-10 ENCOUNTER — Other Ambulatory Visit: Payer: Self-pay | Admitting: Primary Care

## 2020-03-10 DIAGNOSIS — I10 Essential (primary) hypertension: Secondary | ICD-10-CM

## 2020-03-14 ENCOUNTER — Telehealth: Payer: Self-pay

## 2020-03-14 NOTE — Telephone Encounter (Signed)
[facility] Night - Client Nonclinical Telephone Record AccessNurse Client [facility] Night - Client Client Site [facility] - Night Physician Vernona Rieger - NP Contact Type Call Who Is Calling Patient / Member / Family / Caregiver Caller Name Ingeborg Fite Caller Phone Number [phone] Patient Name Christina Bell Patient DOB 20-Jan-1966 Call Type Message Only Information Provided Reason for Call Request to Schedule Office Appointment Initial Comment Caller states she may have an ear infection. She would like an appt. Additional Comment Declined to speak to a nurse. Disp. Time Disposition Final User 03/13/2020 5:05:01 PM General Information Provided Yes Chyrel Masson Call Closed By: Chyrel Masson Transaction Date/Time: 03/13/2020 5:03:06 PM (ET)

## 2020-03-15 ENCOUNTER — Telehealth (INDEPENDENT_AMBULATORY_CARE_PROVIDER_SITE_OTHER): Payer: Commercial Managed Care - PPO | Admitting: Physician Assistant

## 2020-03-15 ENCOUNTER — Other Ambulatory Visit: Payer: Self-pay | Admitting: Primary Care

## 2020-03-15 ENCOUNTER — Encounter: Payer: Self-pay | Admitting: Physician Assistant

## 2020-03-15 ENCOUNTER — Other Ambulatory Visit: Payer: Self-pay

## 2020-03-15 ENCOUNTER — Telehealth: Payer: Self-pay | Admitting: Primary Care

## 2020-03-15 VITALS — BP 147/81 | HR 84 | Temp 96.9°F

## 2020-03-15 DIAGNOSIS — I10 Essential (primary) hypertension: Secondary | ICD-10-CM

## 2020-03-15 DIAGNOSIS — H60502 Unspecified acute noninfective otitis externa, left ear: Secondary | ICD-10-CM

## 2020-03-15 DIAGNOSIS — K219 Gastro-esophageal reflux disease without esophagitis: Secondary | ICD-10-CM

## 2020-03-15 MED ORDER — CIPROFLOXACIN-DEXAMETHASONE 0.3-0.1 % OT SUSP: OTIC | 0 refills | Status: DC

## 2020-03-15 MED ORDER — METOPROLOL SUCCINATE ER 50 MG PO TB24: 50.0000 mg | ORAL_TABLET | Freq: Every day | ORAL | 0 refills | Status: DC

## 2020-03-15 NOTE — Telephone Encounter (Signed)
Left message to return call to our office.  

## 2020-03-15 NOTE — Patient Instructions (Signed)
Instructions sent to patients MyChart.

## 2020-03-15 NOTE — Telephone Encounter (Signed)
Virtual made with [place] today. Pt given information and scheduled CPE with Jae Dire later in month as well.

## 2020-03-15 NOTE — Progress Notes (Signed)
Virtual Visit via Video   I connected with patient on 03/15/20 at  3:30 PM EDT by a video enabled telemedicine application and verified that I am speaking with the correct person using two identifiers.  Location patient: Home Location provider: [place] [facility], Office Persons participating in the virtual visit: Patient, Provider, CMA (Patina Moore)  I discussed the limitations of evaluation and management by telemedicine and the availability of in person appointments. The patient expressed understanding and agreed to proceed.  Subjective:   HPI:   Patient presents via [facility] today c/o 3 days of left ear pain and swelling with some noted drainage and preauricular pain. Does use Qtips. Denies fever, chills. Denies change in hearing. Denies symptoms of the R ear. Started OTC swimmer's ears drop but these are not helping much. Notes it hurts to wiggle her ear.   ROS:   See pertinent positives and negatives per HPI.  Patient Active Problem List   Diagnosis Date Noted  . Chronic neck pain 11/12/2017  . Hyperlipidemia 12/07/2016  . Elevated LFTs 12/07/2016  . Preventative health care 12/07/2016  . Recurrent UTI 08/28/2016  . Essential hypertension 06/30/2016  . Gastroesophageal reflux disease 06/30/2016    Social History   Tobacco Use  . Smoking status: Current Every Day Smoker    Packs/day: 0.50    Years: 25.00    Pack years: 12.50    Types: Cigarettes  . Smokeless tobacco: Never Used  Substance Use Topics  . Alcohol use: Yes    Alcohol/week: 2.0 standard drinks    Types: 2 Standard drinks or equivalent per week    Current Outpatient Medications:  .  amLODipine (NORVASC) 10 MG tablet, TAKE 1 TABLET BY MOUTH EVERY DAY, Disp: 90 tablet, Rfl: 3 .  hydrochlorothiazide (HYDRODIURIL) 25 MG tablet, TAKE 1 TABLET BY MOUTH EVERY DAY, Disp: 90 tablet, Rfl: 0 .  metoprolol succinate (TOPROL-XL) 50 MG 24 hr tablet, Take 1 tablet (50 mg total) by mouth daily. Take with or  immediately following a meal., Disp: 30 tablet, Rfl: 0 .  omeprazole (PRILOSEC) 40 MG capsule, TAKE 1 CAPSULE (40 MG TOTAL) BY MOUTH DAILY. FOR HEARTBURN, Disp: 30 capsule, Rfl: 0 .  ciprofloxacin-dexamethasone (CIPRODEX) OTIC suspension, Apply 4 drops to the affected ear(s) twice daily x 7 days, Disp: 7.5 mL, Rfl: 0  Allergies  Allergen Reactions  . Morphine And Related Hives    Objective:   BP (!) 179/92   Pulse 84   Temp (!) 96.9 F (36.1 C) (Oral)   Patient is well-developed, well-nourished in no acute distress.  Resting comfortably at home.  Head is normocephalic, atraumatic.  No labored breathing.  Speech is clear and coherent with logical content.  Patient is alert and oriented at baseline.   Assessment and Plan:   1. Acute otitis externa of left ear, unspecified type Rx Ciprodex. Supportive measures and OTC medications reviewed with patient. Strict return precautions discussed. Alarm signs/symptoms prompting need for ER evaluation also reviewed.   2. Essential hypertension BP quite elevated. Asymptomatic. Patient notes she just ran in the door to start her visit. She endorses taking all of her medications as directed. Patient denies chest pain, palpitations, lightheadedness, dizziness, vision changes or frequent headaches. She has been asked to rest for 10 minutes with feet flat on the ground and recheck BP. She is to call or message [name] with numbers so we can make adjustments if needed. Otherwise she has been encouraged to schedule follow-up with her PCP.  Piedad Climes, PA-C 03/15/2020

## 2020-03-15 NOTE — Progress Notes (Signed)
I have discussed the procedure for the virtual visit with the patient who has given consent to proceed with assessment and treatment.   Christina Bell, CMA     

## 2020-03-15 NOTE — Telephone Encounter (Signed)
Viewed and patient scheduled for mychart visit at another office for this issue.

## 2020-03-15 NOTE — Telephone Encounter (Signed)
Pt called in wanted to know if she can get a prescrption for swimmers ear.  Bad ear pain . She has had this before.

## 2020-03-15 NOTE — Telephone Encounter (Signed)
Need to call patient for appointment

## 2020-04-04 ENCOUNTER — Other Ambulatory Visit: Payer: Self-pay

## 2020-04-04 ENCOUNTER — Encounter: Payer: Self-pay | Admitting: Primary Care

## 2020-04-04 ENCOUNTER — Ambulatory Visit (INDEPENDENT_AMBULATORY_CARE_PROVIDER_SITE_OTHER): Payer: Commercial Managed Care - PPO | Admitting: Primary Care

## 2020-04-04 ENCOUNTER — Other Ambulatory Visit (HOSPITAL_COMMUNITY)
Admission: RE | Admit: 2020-04-04 | Discharge: 2020-04-04 | Disposition: A | Discharge status: Home / Self Care | Payer: Commercial Managed Care - PPO | Source: Ambulatory Visit | Attending: Primary Care | Admitting: Primary Care

## 2020-04-04 VITALS — BP 118/70 | HR 75 | Temp 97.4°F | Ht 62.0 in | Wt 139.0 lb

## 2020-04-04 DIAGNOSIS — Z0000 Encounter for general adult medical examination without abnormal findings: Secondary | ICD-10-CM | POA: Diagnosis not present

## 2020-04-04 DIAGNOSIS — Z1159 Encounter for screening for other viral diseases: Secondary | ICD-10-CM

## 2020-04-04 DIAGNOSIS — I10 Essential (primary) hypertension: Secondary | ICD-10-CM

## 2020-04-04 DIAGNOSIS — Z124 Encounter for screening for malignant neoplasm of cervix: Secondary | ICD-10-CM

## 2020-04-04 DIAGNOSIS — K219 Gastro-esophageal reflux disease without esophagitis: Secondary | ICD-10-CM

## 2020-04-04 DIAGNOSIS — Z1231 Encounter for screening mammogram for malignant neoplasm of breast: Secondary | ICD-10-CM

## 2020-04-04 DIAGNOSIS — E785 Hyperlipidemia, unspecified: Secondary | ICD-10-CM

## 2020-04-04 DIAGNOSIS — Z114 Encounter for screening for human immunodeficiency virus [HIV]: Secondary | ICD-10-CM

## 2020-04-04 DIAGNOSIS — Z122 Encounter for screening for malignant neoplasm of respiratory organs: Secondary | ICD-10-CM | POA: Diagnosis not present

## 2020-04-04 LAB — COMPREHENSIVE METABOLIC PANEL
ALT: 44 U/L — ABNORMAL HIGH (ref 0–35)
AST: 64 U/L — ABNORMAL HIGH (ref 0–37)
Albumin: 4.5 g/dL (ref 3.5–5.2)
Alkaline Phosphatase: 66 U/L (ref 39–117)
BUN: 15 mg/dL (ref 6–23)
CO2: 26 mEq/L (ref 19–32)
Calcium: 9.5 mg/dL (ref 8.4–10.5)
Chloride: 104 mEq/L (ref 96–112)
Creatinine, Ser: 0.57 mg/dL (ref 0.40–1.20)
GFR: 102.25 mL/min (ref >60.00)
Glucose, Bld: 78 mg/dL (ref 70–99)
Potassium: 4.5 mEq/L (ref 3.5–5.1)
Sodium: 139 mEq/L (ref 135–145)
Total Bilirubin: 1 mg/dL (ref 0.2–1.2)
Total Protein: 7.6 g/dL (ref 6.0–8.3)

## 2020-04-04 LAB — LIPID PANEL
Cholesterol: 219 mg/dL — ABNORMAL HIGH (ref 0–200)
HDL: 99.5 mg/dL (ref >39.00)
LDL Cholesterol: 109 mg/dL — ABNORMAL HIGH (ref 0–99)
NonHDL: 119.59
Total CHOL/HDL Ratio: 2
Triglycerides: 55 mg/dL (ref 0.0–149.0)
VLDL: 11 mg/dL (ref 0.0–40.0)

## 2020-04-04 LAB — CBC
HCT: 42.8 % (ref 36.0–46.0)
Hemoglobin: 14.4 g/dL (ref 12.0–15.0)
MCHC: 33.5 g/dL (ref 30.0–36.0)
MCV: 93.2 fl (ref 78.0–100.0)
Platelets: 220 10*3/uL (ref 150.0–400.0)
RBC: 4.6 Mil/uL (ref 3.87–5.11)
RDW: 13.1 % (ref 11.5–15.5)
WBC: 5.4 10*3/uL (ref 4.0–10.5)

## 2020-04-04 MED ORDER — HYDROCHLOROTHIAZIDE 25 MG PO TABS: 25.0000 mg | ORAL_TABLET | Freq: Every day | ORAL | 3 refills | Status: DC

## 2020-04-04 MED ORDER — OMEPRAZOLE 40 MG PO CPDR: 40.0000 mg | DELAYED_RELEASE_CAPSULE | Freq: Every day | ORAL | 3 refills | Status: DC

## 2020-04-04 NOTE — Assessment & Plan Note (Signed)
Declines influenza and Shingles vaccination. Pap smear due, completed today. Mammogram due, ordered.  Colonoscopy UTD per patient, due in 2025.  Discussed the importance of a healthy diet and regular exercise in order for weight loss, and to reduce the risk of any potential medical problems.  Exam today unremarkable. Labs pending.

## 2020-04-04 NOTE — Assessment & Plan Note (Signed)
Taking omeprazole for Barrett's esophagus.  Continue omeprazole 40 mg.

## 2020-04-04 NOTE — Patient Instructions (Signed)
Stop by the lab prior to leaving today. I will notify you of your results once received.   You will be contacted regarding your referral to lung cancer screening.  Please let us know if you have not been contacted within two weeks.   It was a pleasure to see you today!   Preventive Care 55-55 Years Old, Female Preventive care refers to visits with your health care provider and lifestyle choices that can promote health and wellness. This includes:  A yearly physical exam. This may also be called an annual well check.  Regular dental visits and eye exams.  Immunizations.  Screening for certain conditions.  Healthy lifestyle choices, such as eating a healthy diet, getting regular exercise, not using drugs or products that contain nicotine and tobacco, and limiting alcohol use. What can I expect for my preventive care visit? Physical exam Your health care provider will check your:  Height and weight. This may be used to calculate body mass index (BMI), which tells if you are at a healthy weight.  Heart rate and blood pressure.  Skin for abnormal spots. Counseling Your health care provider may ask you questions about your:  Alcohol, tobacco, and drug use.  Emotional well-being.  Home and relationship well-being.  Sexual activity.  Eating habits.  Work and work Statistician.  Method of birth control.  Menstrual cycle.  Pregnancy history. What immunizations do I need?  Influenza (flu) vaccine  This is recommended every year. Tetanus, diphtheria, and pertussis (Tdap) vaccine  You may need a Td booster every 10 years. Varicella (chickenpox) vaccine  You may need this if you have not been vaccinated. Zoster (shingles) vaccine  You may need this after age 55. Measles, mumps, and rubella (MMR) vaccine  You may need at least one dose of MMR if you were born in 1957 or later. You may also need a second dose. Pneumococcal conjugate (PCV13) vaccine  You may need this  if you have certain conditions and were not previously vaccinated. Pneumococcal polysaccharide (PPSV23) vaccine  You may need one or two doses if you smoke cigarettes or if you have certain conditions. Meningococcal conjugate (MenACWY) vaccine  You may need this if you have certain conditions. Hepatitis A vaccine  You may need this if you have certain conditions or if you travel or work in places where you may be exposed to hepatitis A. Hepatitis B vaccine  You may need this if you have certain conditions or if you travel or work in places where you may be exposed to hepatitis B. Haemophilus influenzae type b (Hib) vaccine  You may need this if you have certain conditions. Human papillomavirus (HPV) vaccine  If recommended by your health care provider, you may need three doses over 6 months. You may receive vaccines as individual doses or as more than one vaccine together in one shot (combination vaccines). Talk with your health care provider about the risks and benefits of combination vaccines. What tests do I need? Blood tests  Lipid and cholesterol levels. These may be checked every 5 years, or more frequently if you are over 5 years old.  Hepatitis C test.  Hepatitis B test. Screening  Lung cancer screening. You may have this screening every year starting at age 55 if you have a 30-pack-year history of smoking and currently smoke or have quit within the past 15 years.  Colorectal cancer screening. All adults should have this screening starting at age 55 and continuing until age 26. Your health  care provider may recommend screening at age 55 if you are at increased risk. You will have tests every 1-10 years, depending on your results and the type of screening test.  Diabetes screening. This is done by checking your blood sugar (glucose) after you have not eaten for a while (fasting). You may have this done every 1-3 years.  Mammogram. This may be done every 1-2 years. Talk  with your health care provider about when you should start having regular mammograms. This may depend on whether you have a family history of breast cancer.  BRCA-related cancer screening. This may be done if you have a family history of breast, ovarian, tubal, or peritoneal cancers.  Pelvic exam and Pap test. This may be done every 3 years starting at age 55. Starting at age 55, this may be done every 5 years if you have a Pap test in combination with an HPV test. Other tests  Sexually transmitted disease (STD) testing.  Bone density scan. This is done to screen for osteoporosis. You may have this scan if you are at high risk for osteoporosis. Follow these instructions at home: Eating and drinking  Eat a diet that includes fresh fruits and vegetables, whole grains, lean protein, and low-fat dairy.  Take vitamin and mineral supplements as recommended by your health care provider.  Do not drink alcohol if: ? Your health care provider tells you not to drink. ? You are pregnant, may be pregnant, or are planning to become pregnant.  If you drink alcohol: ? Limit how much you have to 0-1 drink a day. ? Be aware of how much alcohol is in your drink. In the [place] one drink equals one 12 oz bottle of beer (355 mL), one 5 oz glass of wine (148 mL), or one 1 oz glass of hard liquor (44 mL). Lifestyle  Take daily care of your teeth and gums.  Stay active. Exercise for at least 30 minutes on 5 or more days each week.  Do not use any products that contain nicotine or tobacco, such as cigarettes, e-cigarettes, and chewing tobacco. If you need help quitting, ask your health care provider.  If you are sexually active, practice safe sex. Use a condom or other form of birth control (contraception) in order to prevent pregnancy and STIs (sexually transmitted infections).  If told by your health care provider, take low-dose aspirin daily starting at age 49. What's next?  Visit your health care  provider once a year for a well check visit.  Ask your health care provider how often you should have your eyes and teeth checked.  Stay up to date on all vaccines. This information is not intended to replace advice given to you by your health care provider. Make sure you discuss any questions you have with your health care provider. Document Revised: 01/13/2018 Document Reviewed: 01/13/2018 Elsevier Patient Education  2020 [employer].

## 2020-04-04 NOTE — Assessment & Plan Note (Signed)
Well controlled in the office today. Continue HCTZ 25 mg, amlodipine 10 mg, metoprolol succinate 50 mg.   CMP pending.

## 2020-04-04 NOTE — Assessment & Plan Note (Signed)
Repeat lipid panel pending, not currently on statin therapy.  The 10-year ASCVD risk score Denman George DC Montez Hageman., et al., 2013) is: 4%   Values used to calculate the score:     Age: 54 years     Sex: Female     Is Non-Hispanic African American: No     Diabetic: No     Tobacco smoker: Yes     Systolic Blood Pressure: 118 mmHg     Is BP treated: Yes     HDL Cholesterol: 86.4 mg/dL     Total Cholesterol: 223 mg/dL

## 2020-04-04 NOTE — Progress Notes (Signed)
Subjective:    Patient ID: Christina Bell, female    DOB: [birth year], 54 y.o.   MRN: [id number]  HPI  This visit occurred during the SARS-CoV-2 public health emergency.  Safety protocols were in place, including screening questions prior to the visit, additional usage of staff PPE, and extensive cleaning of exam room while observing appropriate contact time as indicated for disinfecting solutions.   Christina Bell is a 54 year old female who presents today for complete physical.  Immunizations: -Tetanus: Completed in 2020 -Influenza: Declines  -Shingles: Never completed  -Covid-19: Completed series  Diet: She endorses a healthy diet.  Exercise: Very active at work  Eye exam: No recent exam Dental exam: Completes regularly   Pap Smear: Completed in 2018, due today Mammogram: Completed in 2020 Colonoscopy: Completed in 2015, 2025 Hep C Screen: Pending  Lung Cancer Screening: She is currently smoking 1 PPD for 35 years.   BP Readings from Last 3 Encounters:  04/04/20 118/70  03/15/20 (!) 147/81  09/26/19 140/80      Review of Systems  Constitutional: Negative for unexpected weight change.  HENT: Negative for rhinorrhea.   Eyes: Negative for visual disturbance.  Respiratory: Negative for cough and shortness of breath.   Cardiovascular: Negative for chest pain.  Gastrointestinal: Negative for constipation and diarrhea.  Genitourinary: Negative for difficulty urinating.  Musculoskeletal: Negative for arthralgias and myalgias.  Skin: Negative for rash.  Allergic/Immunologic: Negative for environmental allergies.  Neurological: Negative for dizziness and headaches.  Hematological: Negative for adenopathy.  Psychiatric/Behavioral: The patient is not nervous/anxious.        Past Medical History:  Diagnosis Date  . Acute neck pain 11/12/2017  . Barrett esophagus   . GERD (gastroesophageal reflux disease)   . H. pylori infection   . Heart murmur   . Hypertension        Social History   Socioeconomic History  . Marital status: Married    Spouse name: Not on file  . Number of children: Not on file  . Years of education: Not on file  . Highest education level: Not on file  Occupational History  . Not on file  Tobacco Use  . Smoking status: Current Every Day Smoker    Packs/day: 0.50    Years: 25.00    Pack years: 12.50    Types: Cigarettes  . Smokeless tobacco: Never Used  Substance and Sexual Activity  . Alcohol use: Yes    Alcohol/week: 2.0 standard drinks    Types: 2 Standard drinks or equivalent per week  . Drug use: Not on file  . Sexual activity: Not on file  Other Topics Concern  . Not on file  Social History Narrative   Married.   3 children.   Worked as a Runner, broadcasting/film/video.    Enjoys waking, bicycling, driving.   Social Determinants of Health   Financial Resource Strain:   . Difficulty of Paying Living Expenses: Not on file  Food Insecurity:   . Worried About Programme researcher, broadcasting/film/video in the Last Year: Not on file  . Ran Out of Food in the Last Year: Not on file  Transportation Needs:   . Lack of Transportation (Medical): Not on file  . Lack of Transportation (Non-Medical): Not on file  Physical Activity:   . Days of Exercise per Week: Not on file  . Minutes of Exercise per Session: Not on file  Stress:   . Feeling of Stress : Not on file  Social Connections:   .  Frequency of Communication with Friends and Family: Not on file  . Frequency of Social Gatherings with Friends and Family: Not on file  . Attends Religious Services: Not on file  . Active Member of Clubs or Organizations: Not on file  . Attends Banker Meetings: Not on file  . Marital Status: Not on file  Intimate Partner Violence:   . Fear of Current or Ex-Partner: Not on file  . Emotionally Abused: Not on file  . Physically Abused: Not on file  . Sexually Abused: Not on file    Past Surgical History:  Procedure Laterality Date  . BREAST BIOPSY Right     duct removed- neg  . BREAST BIOPSY Left 05/02/2019   [name] bx/ribbon clip  . BREAST BIOPSY Right 05/02/2019   [name] bx/ coil clip  . CESAREAN SECTION    . CHOLECYSTECTOMY    . ECTOPIC PREGNANCY SURGERY      Family History  Problem Relation Age of Onset  . Hypertension Mother   . Lung cancer Maternal Grandmother   . Breast cancer Neg Hx     Allergies  Allergen Reactions  . Morphine And Related Hives    Current Outpatient Medications on File Prior to Visit  Medication Sig Dispense Refill  . amLODipine (NORVASC) 10 MG tablet TAKE 1 TABLET BY MOUTH EVERY DAY 90 tablet 3  . metoprolol succinate (TOPROL-XL) 50 MG 24 hr tablet Take 1 tablet (50 mg total) by mouth daily. Take with or immediately following a meal. 30 tablet 0   No current facility-administered medications on file prior to visit.    BP 118/70   Pulse 75   Temp (!) 97.4 F (36.3 C) (Temporal)   Ht 5\' 2"  (1.575 m)   Wt 139 lb (63 kg)   SpO2 95%   BMI 25.42 kg/m    Objective:   Physical Exam HENT:     Right Ear: Tympanic membrane and ear canal normal.     Left Ear: Tympanic membrane and ear canal normal.  Eyes:     Pupils: Pupils are equal, round, and reactive to light.  Cardiovascular:     Rate and Rhythm: Normal rate and regular rhythm.  Pulmonary:     Effort: Pulmonary effort is normal.     Breath sounds: Normal breath sounds.  Abdominal:     General: Bowel sounds are normal.     Palpations: Abdomen is soft.     Tenderness: There is no abdominal tenderness.  Genitourinary:    Labia:        Right: No tenderness or lesion.        Left: No tenderness or lesion.      Cervix: No cervical motion tenderness, discharge or cervical bleeding.     Adnexa: Right adnexa normal and left adnexa normal.  Musculoskeletal:        General: Normal range of motion.     Cervical back: Neck supple.  Skin:    General: Skin is warm and dry.  Neurological:     Mental Status: She is alert and oriented to person, place, and  time.     Cranial Nerves: No cranial nerve deficit.     Deep Tendon Reflexes:     Reflex Scores:      Patellar reflexes are 2+ on the right side and 2+ on the left side. Psychiatric:        Mood and Affect: Mood normal.  Assessment & Plan:

## 2020-04-05 LAB — HEPATITIS C ANTIBODY
Hepatitis C Ab: NONREACTIVE
SIGNAL TO CUT-OFF: 0.01 (ref <1.00)

## 2020-04-05 LAB — HIV ANTIBODY (ROUTINE TESTING W REFLEX): HIV 1&2 Ab, 4th Generation: NONREACTIVE

## 2020-04-08 ENCOUNTER — Telehealth: Payer: Self-pay

## 2020-04-08 LAB — CYTOLOGY - PAP
Adequacy: ABSENT
Comment: NEGATIVE
Diagnosis: NEGATIVE
High risk HPV: NEGATIVE

## 2020-04-08 NOTE — Telephone Encounter (Signed)
Contacted patient for lung CT screening clinic based on referral from Vernona Rieger.  Message left for patient to call Glenna Fellows, lung navigator to schedule CT scan.

## 2020-04-16 ENCOUNTER — Other Ambulatory Visit: Payer: Self-pay | Admitting: Primary Care

## 2020-04-16 DIAGNOSIS — I10 Essential (primary) hypertension: Secondary | ICD-10-CM

## 2020-04-23 ENCOUNTER — Other Ambulatory Visit: Payer: Self-pay | Admitting: Primary Care

## 2020-04-23 DIAGNOSIS — I10 Essential (primary) hypertension: Secondary | ICD-10-CM

## 2020-04-24 NOTE — Telephone Encounter (Signed)
Pharmacy requests refill on: Metoprolol Succinate ER 50 mg   LAST REFILL: 03/15/2020 (Q-30, R-0) LAST OV: 04/04/2020 NEXT OV: Not Scheduled  PHARMACY: [id number] [place], [place]

## 2020-05-24 ENCOUNTER — Telehealth: Payer: Self-pay | Admitting: *Deleted

## 2020-05-24 NOTE — Telephone Encounter (Signed)
Received referral for low dose lung cancer screening CT scan. Message left at phone number listed in EMR for patient to call me back to facilitate scheduling scan.  

## 2020-06-12 ENCOUNTER — Telehealth (INDEPENDENT_AMBULATORY_CARE_PROVIDER_SITE_OTHER): Payer: Commercial Managed Care - PPO | Admitting: Internal Medicine

## 2020-06-12 ENCOUNTER — Encounter: Payer: Self-pay | Admitting: Internal Medicine

## 2020-06-12 DIAGNOSIS — J0140 Acute pansinusitis, unspecified: Secondary | ICD-10-CM | POA: Diagnosis not present

## 2020-06-12 MED ORDER — AMOXICILLIN 500 MG PO CAPS: 500.0000 mg | ORAL_CAPSULE | Freq: Three times a day (TID) | ORAL | 0 refills | Status: DC

## 2020-06-12 NOTE — Patient Instructions (Signed)

## 2020-06-12 NOTE — Progress Notes (Signed)
Virtual Visit via Video Note  I connected with Christina Bell on 06/12/20 at  3:00 PM EST by a video enabled telemedicine application and verified that I am speaking with the correct person using two identifiers.  Location: Patient: Home Provider: Office  Persons participating in this video call: Nicki Reaper, NP and Christina Bell   I discussed the limitations of evaluation and management by telemedicine and the availability of in person appointments. The patient expressed understanding and agreed to proceed.  History of Present Illness:  Patient reports headache, runny nose and nasal congestion.  This started 1 week ago.  The headache is located in the left side of her forehead, behind her left eye and in her left cheek.  She describes the pain as pressure.  She denies dizziness or visual changes.  She is blowing yellow mucus out of her nose.  She denies ear pain, sore throat, loss of smell, cough or shortness of breath.  She does have loss of taste secondary to contracting COVID at the beginning of January.  She denies fever, chills or body aches.  She has tried a decongestant, saline nasal spray, Benadryl and ibuprofen OTC with minimal relief of symptoms.  She has had her COVID vaccine.  She does smoke.   Past Medical History:  Diagnosis Date  . Acute neck pain 11/12/2017  . Barrett esophagus   . GERD (gastroesophageal reflux disease)   . H. pylori infection   . Heart murmur   . Hypertension     Current Outpatient Medications  Medication Sig Dispense Refill  . amLODipine (NORVASC) 10 MG tablet TAKE 1 TABLET BY MOUTH EVERY DAY 90 tablet 3  . hydrochlorothiazide (HYDRODIURIL) 25 MG tablet Take 1 tablet (25 mg total) by mouth daily. For blood pressure. 90 tablet 3  . metoprolol succinate (TOPROL-XL) 50 MG 24 hr tablet TAKE 1 TABLET BY MOUTH DAILY. TAKE WITH OR IMMEDIATELY FOLLOWING A MEAL. 30 tablet 5  . omeprazole (PRILOSEC) 40 MG capsule Take 1 capsule (40 mg total) by mouth daily.  For heartburn 90 capsule 3   No current facility-administered medications for this visit.    Allergies  Allergen Reactions  . Morphine And Related Hives    Family History  Problem Relation Age of Onset  . Hypertension Mother   . Lung cancer Maternal Grandmother   . Breast cancer Neg Hx     Social History   Socioeconomic History  . Marital status: Married    Spouse name: Not on file  . Number of children: Not on file  . Years of education: Not on file  . Highest education level: Not on file  Occupational History  . Not on file  Tobacco Use  . Smoking status: Current Every Day Smoker    Packs/day: 0.50    Years: 25.00    Pack years: 12.50    Types: Cigarettes  . Smokeless tobacco: Never Used  Substance and Sexual Activity  . Alcohol use: Yes    Alcohol/week: 2.0 standard drinks    Types: 2 Standard drinks or equivalent per week  . Drug use: Not on file  . Sexual activity: Not on file  Other Topics Concern  . Not on file  Social History Narrative   Married.   3 children.   Worked as a Runner, broadcasting/film/video.    Enjoys waking, bicycling, driving.   Social Determinants of Health   Financial Resource Strain: Not on file  Food Insecurity: Not on file  Transportation Needs: Not on file  Physical Activity: Not on file  Stress: Not on file  Social Connections: Not on file  Intimate Partner Violence: Not on file     Constitutional: Patient reports headache.  Denies fever, malaise, fatigue, or abrupt weight changes.  HEENT: Patient reports runny nose and nasal congestion.  Denies eye pain, eye redness, ear pain, ringing in the ears, wax buildup, bloody nose, or sore throat. Respiratory: Denies difficulty breathing, shortness of breath, cough or sputum production.   Cardiovascular: Denies chest pain, chest tightness, palpitations or swelling in the hands or feet.  Gastrointestinal: Denies abdominal pain, bloating, constipation, diarrhea or blood in the stool.   No other specific  complaints in a complete review of systems (except as listed in HPI above).  Observations/Objective:  Wt Readings from Last 3 Encounters:  04/04/20 139 lb (63 kg)  09/26/19 144 lb 1 oz (65.3 kg)  01/31/19 142 lb (64.4 kg)    General: Appears her stated age,  in NAD. HEENT: Nose: Congestion noted; Throat/Mouth: Slight hoarseness noted Pulmonary/Chest: Normal effort. No respiratory distress.   Neurological: Alert and oriented.   BMET    Component Value Date/Time   NA 139 04/04/2020 0853   K 4.5 04/04/2020 0853   CL 104 04/04/2020 0853   CO2 26 04/04/2020 0853   GLUCOSE 78 04/04/2020 0853   BUN 15 04/04/2020 0853   CREATININE 0.57 04/04/2020 0853   CALCIUM 9.5 04/04/2020 0853    Lipid Panel     Component Value Date/Time   CHOL 219 (H) 04/04/2020 0853   TRIG 55.0 04/04/2020 0853   HDL 99.50 04/04/2020 0853   CHOLHDL 2 04/04/2020 0853   VLDL 11.0 04/04/2020 0853   LDLCALC 109 (H) 04/04/2020 0853    CBC    Component Value Date/Time   WBC 5.4 04/04/2020 0853   RBC 4.60 04/04/2020 0853   HGB 14.4 04/04/2020 0853   HCT 42.8 04/04/2020 0853   PLT 220.0 04/04/2020 0853   MCV 93.2 04/04/2020 0853   MCHC 33.5 04/04/2020 0853   RDW 13.1 04/04/2020 0853    Hgb A1C No results found for: HGBA1C     Assessment and Plan:  Acute Pansinusitis:  Can use a Nettie pot which can be purchased from your local drugstore Rx for Amoxil 500 mg 3 times daily x10 days Recommend Zyrtec and Flonase OTC  Return precautions discussed Follow Up Instructions:    I discussed the assessment and treatment plan with the patient. The patient was provided an opportunity to ask questions and all were answered. The patient agreed with the plan and demonstrated an understanding of the instructions.   The patient was advised to call back or seek an in-person evaluation if the symptoms worsen or if the condition fails to improve as anticipated.    Nicki Reaper, NP

## 2020-06-19 ENCOUNTER — Other Ambulatory Visit: Payer: Self-pay | Admitting: Primary Care

## 2020-06-19 DIAGNOSIS — I10 Essential (primary) hypertension: Secondary | ICD-10-CM

## 2020-06-20 NOTE — Telephone Encounter (Signed)
Pharmacy requests refill on: Metoprolol Succinate 50 mg 24 hr   LAST REFILL: 04/24/2020 (Q-30, R-5) LAST OV: 04/04/2020 NEXT OV: Not Scheduled  PHARMACY: [id number] [place], [place]

## 2020-08-01 ENCOUNTER — Encounter: Payer: Self-pay | Admitting: *Deleted

## 2020-10-22 ENCOUNTER — Other Ambulatory Visit: Payer: Self-pay | Admitting: Primary Care

## 2020-10-22 DIAGNOSIS — I10 Essential (primary) hypertension: Secondary | ICD-10-CM

## 2020-11-22 ENCOUNTER — Other Ambulatory Visit: Payer: Self-pay | Admitting: Primary Care

## 2020-11-22 DIAGNOSIS — I10 Essential (primary) hypertension: Secondary | ICD-10-CM

## 2020-12-18 ENCOUNTER — Telehealth: Payer: Self-pay

## 2020-12-18 ENCOUNTER — Other Ambulatory Visit: Payer: Self-pay

## 2020-12-18 ENCOUNTER — Telehealth (INDEPENDENT_AMBULATORY_CARE_PROVIDER_SITE_OTHER): Payer: Commercial Managed Care - PPO | Admitting: Family Medicine

## 2020-12-18 ENCOUNTER — Encounter: Payer: Self-pay | Admitting: Family Medicine

## 2020-12-18 DIAGNOSIS — U071 COVID-19: Secondary | ICD-10-CM

## 2020-12-18 HISTORY — DX: COVID-19: U07.1

## 2020-12-18 MED ORDER — NIRMATRELVIR/RITONAVIR (PAXLOVID)TABLET: 3.0000 | ORAL_TABLET | Freq: Two times a day (BID) | ORAL | 0 refills | Status: AC

## 2020-12-18 NOTE — Telephone Encounter (Signed)
Noted and appreciate Dr. G's evaluation.  

## 2020-12-18 NOTE — Progress Notes (Signed)
Patient ID: Christina Bell, female    DOB: 1966/01/09, 55 y.o.   MRN: [id number]  Virtual visit completed through MyChart, a video enabled telemedicine application. Due to national recommendations of social distancing due to COVID-19, a virtual visit is felt to be most appropriate for this patient at this time. Reviewed limitations, risks, security and privacy concerns of performing a virtual visit and the availability of in person appointments. I also reviewed that there may be a patient responsible charge related to this service. The patient agreed to proceed.   Patient location: home Provider location: [place] at [facility], office Persons participating in this virtual visit: patient, provider, husband Mellody Dance   If any vitals were documented, they were collected by patient at home unless specified below.    Temp 99.9 F (37.7 C) (Axillary)   Ht 5\' 2"  (1.575 m)   Wt 141 lb 2 oz (64 kg)   BMI 25.81 kg/m    CC: COVID positive Subjective:   HPI: Christina Bell is a 55 y.o. female presenting on 12/18/2020 for Fever (C/o fever- max 101.1and S/T.  Sxs started 12/16/20.  Pos home COVID test on 12/17/20.  Has had 2 Pfizer doses. )   First day of symptoms: 12/16/2020 Tested COVID positive: 12/17/2020  Current symptoms: fever Tmax 101.1, ST, HA, body aches, fatigue, minimal cough, loss of smell  No: dyspnea, loss of taste, abd pain, nausea, diarrhea  Treatments to date: ibuprofen.  Risk factors include: HTN  COVID vaccination status: Pfizer x2  COVID infection 05/2020. Did fully recover at that time.       Relevant past medical, surgical, family and social history reviewed and updated as indicated. Interim medical history since our last visit reviewed. Allergies and medications reviewed and updated. Outpatient Medications Prior to Visit  Medication Sig Dispense Refill   amLODipine (NORVASC) 10 MG tablet TAKE 1 TABLET BY MOUTH EVERY DAY 90 tablet 3   hydrochlorothiazide (HYDRODIURIL) 25 MG  tablet TAKE 1 TABLET BY MOUTH EVERY DAY FOR BLOOD PRESSURE 90 tablet 1   metoprolol succinate (TOPROL-XL) 50 MG 24 hr tablet TAKE 1 TABLET BY MOUTH DAILY. TAKE WITH OR IMMEDIATELY FOLLOWING A MEAL. 90 tablet 0   omeprazole (PRILOSEC) 40 MG capsule Take 1 capsule (40 mg total) by mouth daily. For heartburn 90 capsule 3   amoxicillin (AMOXIL) 500 MG capsule Take 1 capsule (500 mg total) by mouth 3 (three) times daily. 30 capsule 0   No facility-administered medications prior to visit.     Per HPI unless specifically indicated in ROS section below Review of Systems Objective:  Temp 99.9 F (37.7 C) (Axillary)   Ht 5\' 2"  (1.575 m)   Wt 141 lb 2 oz (64 kg)   BMI 25.81 kg/m   Wt Readings from Last 3 Encounters:  12/18/20 141 lb 2 oz (64 kg)  04/04/20 139 lb (63 kg)  09/26/19 144 lb 1 oz (65.3 kg)       Physical exam: Gen: alert, NAD, not ill appearing Pulm: speaks in complete sentences without increased work of breathing Psych: normal mood, normal thought content      Lab Results  Component Value Date   CREATININE 0.57 04/04/2020   BUN 15 04/04/2020   NA 139 04/04/2020   K 4.5 04/04/2020   CL 104 04/04/2020   CO2 26 04/04/2020    Assessment & Plan:   Problem List Items Addressed This Visit     COVID-19 virus infection    Reviewed currently  approved EUA treatments.  Reviewed expected course of illness, anticipated course of recovery, as well as red flags to suggested COVID pneumonia or to seek urgent in-person care. Reviewed latest CDC isolation/quarantining guidelines.  Encouraged fluids and rest. Reviewed further supportive care measures at home including vit C 500mg  bid, vit D 2000 IU daily, zinc 100mg  daily, tylenol PRN.  Recommend:  Full dose Paxlovid Paxlovid drug interactions:  1. Amlodipine - rec take 1/2 tablet while on paxlovid 2. Omeprazole - rec hold while on paxlovid, may take OTC pepcid in its place.   Pt unsure if she will fill given side effects of med.  [facility] paxlovid script sent to pharmacy. Will watch symptoms for 24 hours and decide tomorrow if she will fill or not. Advised let know if worsening symptoms.        Relevant Medications   nirmatrelvir/ritonavir EUA (PAXLOVID) TABS     Meds ordered this encounter  Medications   nirmatrelvir/ritonavir EUA (PAXLOVID) TABS    Sig: Take 3 tablets by mouth 2 (two) times daily for 5 days. (Take nirmatrelvir 150 mg two tablets twice daily for 5 days and ritonavir 100 mg one tablet twice daily for 5 days) Patient GFR is 102    Dispense:  30 tablet    Refill:  0    No orders of the defined types were placed in this encounter.   I discussed the assessment and treatment plan with the patient. The patient was provided an opportunity to ask questions and all were answered. The patient agreed with the plan and demonstrated an understanding of the instructions. The patient was advised to call back or seek an in-person evaluation if the symptoms worsen or if the condition fails to improve as anticipated.  Follow up plan: No follow-ups on file.  , MD

## 2020-12-18 NOTE — Telephone Encounter (Signed)
    Good Morning Geonna, Lockyer had flu like symptoms yesterday, so we performed in house 20 minute Covid test, and she came back positive.  Today much of the same except she has lost taste and smell senses...Marland KitchenMarland KitchenHow should we proceed?  We are very much interested in therapeutics.  We are both home today in quarantine for next 5 days from work.  I have not tested nor do I have any symptoms.  Above message was sent in [employer] chart for [employer].  I spoke with pts husband and pt tested + for covid with home test on 12/17/20; symptoms started on 12/17/20. Pt has fever of 101 this morning, pt took ibuprofen 800 mg this morning ; having body aches,chills,loss taste and smell;S/T hurts to swallow,runny nose and head congestion. No cough and no SOB. No other covid symptoms.  Pt has heart murmur and hypertensive.  Pt is in no distress at this time.scheduled video visit with Dr Reece Agar 12/18/20 at 11:30.UC & ED precautions given an pts husband voiced understanding and pt will self quarantine,drink plenty of fluids, rest and pt taking ibuprofen for fever. Sending note to Mayra Reel NP as PCP Lorain Childes, Dr Hali Marry CMA.

## 2020-12-18 NOTE — Assessment & Plan Note (Addendum)
Reviewed currently approved EUA treatments.  Reviewed expected course of illness, anticipated course of recovery, as well as red flags to suggested COVID pneumonia or to seek urgent in-person care. Reviewed latest CDC isolation/quarantining guidelines.  Encouraged fluids and rest. Reviewed further supportive care measures at home including vit C 500mg  bid, vit D 2000 IU daily, zinc 100mg  daily, tylenol PRN.  Recommend:  Full dose Paxlovid Paxlovid drug interactions:  1. Amlodipine - rec take 1/2 tablet while on paxlovid 2. Omeprazole - rec hold while on paxlovid, may take OTC pepcid in its place.   Pt unsure if she will fill given side effects of med. WASP paxlovid script sent to pharmacy. Will watch symptoms for 24 hours and decide tomorrow if she will fill or not. Advised let know if worsening symptoms.

## 2021-02-27 ENCOUNTER — Other Ambulatory Visit: Payer: Self-pay | Admitting: Primary Care

## 2021-02-27 DIAGNOSIS — I10 Essential (primary) hypertension: Secondary | ICD-10-CM

## 2021-02-27 NOTE — Telephone Encounter (Signed)
Christina Bell, I see that she is scheduled to see me on 03/11/21, but last CPE was 04/04/20. Does she want to bump it out to late November or does she have new insurance?

## 2021-03-03 NOTE — Telephone Encounter (Signed)
Called moved appointment to later in november. No further action needed at this time.

## 2021-03-11 ENCOUNTER — Encounter: Payer: Commercial Managed Care - PPO | Admitting: Primary Care

## 2021-04-07 ENCOUNTER — Telehealth: Payer: Self-pay | Admitting: Primary Care

## 2021-04-07 NOTE — Telephone Encounter (Signed)
Pt called in stating that she has a cpe on 11/22 and would like to get her labs done ahead of time so the provider can have results, but I did not see any lab orders put in to schedule appt.

## 2021-04-07 NOTE — Telephone Encounter (Signed)
Called Patient she will have done at office visit. Informed she only needs to be fasting for 4 hours not all day. She will call if any further questions.

## 2021-04-08 ENCOUNTER — Other Ambulatory Visit: Payer: Self-pay

## 2021-04-08 ENCOUNTER — Ambulatory Visit (INDEPENDENT_AMBULATORY_CARE_PROVIDER_SITE_OTHER): Payer: Commercial Managed Care - PPO | Admitting: Primary Care

## 2021-04-08 ENCOUNTER — Encounter: Payer: Self-pay | Admitting: Primary Care

## 2021-04-08 VITALS — BP 132/74 | HR 79 | Temp 98.0°F | Resp 12 | Ht 62.0 in | Wt 144.4 lb

## 2021-04-08 DIAGNOSIS — Z23 Encounter for immunization: Secondary | ICD-10-CM

## 2021-04-08 DIAGNOSIS — G8929 Other chronic pain: Secondary | ICD-10-CM

## 2021-04-08 DIAGNOSIS — I10 Essential (primary) hypertension: Secondary | ICD-10-CM

## 2021-04-08 DIAGNOSIS — Z1231 Encounter for screening mammogram for malignant neoplasm of breast: Secondary | ICD-10-CM | POA: Diagnosis not present

## 2021-04-08 DIAGNOSIS — M542 Cervicalgia: Secondary | ICD-10-CM

## 2021-04-08 DIAGNOSIS — Z0000 Encounter for general adult medical examination without abnormal findings: Secondary | ICD-10-CM

## 2021-04-08 DIAGNOSIS — K219 Gastro-esophageal reflux disease without esophagitis: Secondary | ICD-10-CM

## 2021-04-08 DIAGNOSIS — E785 Hyperlipidemia, unspecified: Secondary | ICD-10-CM

## 2021-04-08 DIAGNOSIS — R7989 Other specified abnormal findings of blood chemistry: Secondary | ICD-10-CM

## 2021-04-08 LAB — COMPREHENSIVE METABOLIC PANEL
ALT: 19 U/L (ref 0–35)
AST: 27 U/L (ref 0–37)
Albumin: 4.6 g/dL (ref 3.5–5.2)
Alkaline Phosphatase: 62 U/L (ref 39–117)
BUN: 12 mg/dL (ref 6–23)
CO2: 28 mEq/L (ref 19–32)
Calcium: 10 mg/dL (ref 8.4–10.5)
Chloride: 99 mEq/L (ref 96–112)
Creatinine, Ser: 0.58 mg/dL (ref 0.40–1.20)
GFR: 101.1 mL/min (ref >60.00)
Glucose, Bld: 87 mg/dL (ref 70–99)
Potassium: 3.6 mEq/L (ref 3.5–5.1)
Sodium: 136 mEq/L (ref 135–145)
Total Bilirubin: 1.1 mg/dL (ref 0.2–1.2)
Total Protein: 7.5 g/dL (ref 6.0–8.3)

## 2021-04-08 LAB — LIPID PANEL
Cholesterol: 223 mg/dL — ABNORMAL HIGH (ref 0–200)
HDL: 95.9 mg/dL (ref >39.00)
LDL Cholesterol: 119 mg/dL — ABNORMAL HIGH (ref 0–99)
NonHDL: 127.51
Total CHOL/HDL Ratio: 2
Triglycerides: 44 mg/dL (ref 0.0–149.0)
VLDL: 8.8 mg/dL (ref 0.0–40.0)

## 2021-04-08 LAB — CBC
HCT: 42.6 % (ref 36.0–46.0)
Hemoglobin: 14.4 g/dL (ref 12.0–15.0)
MCHC: 33.9 g/dL (ref 30.0–36.0)
MCV: 93.4 fl (ref 78.0–100.0)
Platelets: 223 10*3/uL (ref 150.0–400.0)
RBC: 4.56 Mil/uL (ref 3.87–5.11)
RDW: 12.8 % (ref 11.5–15.5)
WBC: 7.2 10*3/uL (ref 4.0–10.5)

## 2021-04-08 LAB — HEMOGLOBIN A1C: Hgb A1c MFr Bld: 5.3 % (ref 4.6–6.5)

## 2021-04-08 NOTE — Assessment & Plan Note (Addendum)
Shingrix and influenza vaccines due, provided today. Pap smear UTD. Mammogram due, orders placed. Colonoscopy UTD, due 2025 per patient. Declines lung cancer screening despite recommendations.   Discussed the importance of a healthy diet and regular exercise in order for weight loss, and to reduce the risk of further co-morbidity.  Exam today stable. Labs pending.

## 2021-04-08 NOTE — Assessment & Plan Note (Signed)
Stable.  No concerns today. Continue to monitor.  

## 2021-04-08 NOTE — Assessment & Plan Note (Signed)
Borderline high today, improved on recheck  Continue amlodipine 10 mg, HCTZ 25 mg, and metoprolol succinate 50 mg daily.  CMP pending.

## 2021-04-08 NOTE — Patient Instructions (Signed)
Stop by the lab prior to leaving today. I will notify you of your results once received.   [facility] to schedule your mamm[facility].   Schedule a nurse visit to repeat your second Shingles vaccine in 2-6 months.  It was a pleasure to see you today!  Preventive Care 20-56 Years Old, Female Preventive care refers to lifestyle choices and visits with your health care provider that can promote health and wellness. Preventive care visits are also called wellness exams. What can I expect for my preventive care visit? Counseling Your health care provider may ask you questions about your: Medical history, including: Past medical problems. Family medical history. Pregnancy history. Current health, including: Menstrual cycle. Method of birth control. Emotional well-being. Home life and relationship well-being. Sexual activity and sexual health. Lifestyle, including: Alcohol, nicotine or tobacco, and drug use. Access to firearms. Diet, exercise, and sleep habits. Work and work Statistician. Sunscreen use. Safety issues such as seatbelt and bike helmet use. Physical exam Your health care provider will check your: Height and weight. These may be used to calculate your BMI (body mass index). BMI is a measurement that tells if you are at a healthy weight. Waist circumference. This measures the distance around your waistline. This measurement also tells if you are at a healthy weight and may help predict your risk of certain diseases, such as type 2 diabetes and high blood pressure. Heart rate and blood pressure. Body temperature. Skin for abnormal spots. What immunizations do I need? Vaccines are usually given at various ages, according to a schedule. Your health care provider will recommend vaccines for you based on your age, medical history, and lifestyle or other factors, such as travel or where you work. What tests do I need? Screening Your health care provider may recommend  screening tests for certain conditions. This may include: Lipid and cholesterol levels. Diabetes screening. This is done by checking your blood sugar (glucose) after you have not eaten for a while (fasting). Pelvic exam and Pap test. Hepatitis B test. Hepatitis C test. HIV (human immunodeficiency virus) test. STI (sexually transmitted infection) testing, if you are at risk. Lung cancer screening. Colorectal cancer screening. Mammogram. Talk with your health care provider about when you should start having regular mammograms. This may depend on whether you have a family history of breast cancer. BRCA-related cancer screening. This may be done if you have a family history of breast, ovarian, tubal, or peritoneal cancers. Bone density scan. This is done to screen for osteoporosis. Talk with your health care provider about your test results, treatment options, and if necessary, the need for more tests. Follow these instructions at home: Eating and drinking  Eat a diet that includes fresh fruits and vegetables, whole grains, lean protein, and low-fat dairy products. Take vitamin and mineral supplements as recommended by your health care provider. Do not drink alcohol if: Your health care provider tells you not to drink. You are pregnant, may be pregnant, or are planning to become pregnant. If you drink alcohol: Limit how much you have to 0-1 drink a day. Know how much alcohol is in your drink. In the [place] one drink equals one 12 oz bottle of beer (355 mL), one 5 oz glass of wine (148 mL), or one 1 oz glass of hard liquor (44 mL). Lifestyle Brush your teeth every morning and night with fluoride toothpaste. Floss one time each day. Exercise for at least 30 minutes 5 or more days each week. Do not  use any products that contain nicotine or tobacco. These products include cigarettes, chewing tobacco, and vaping devices, such as e-cigarettes. If you need help quitting, ask your health care  provider. Do not use drugs. If you are sexually active, practice safe sex. Use a condom or other form of protection to prevent STIs. If you do not wish to become pregnant, use a form of birth control. If you plan to become pregnant, see your health care provider for a prepregnancy visit. Take aspirin only as told by your health care provider. Make sure that you understand how much to take and what form to take. Work with your health care provider to find out whether it is safe and beneficial for you to take aspirin daily. Find healthy ways to manage stress, such as: Meditation, yoga, or listening to music. Journaling. Talking to a trusted person. Spending time with friends and family. Minimize exposure to UV radiation to reduce your risk of skin cancer. Safety Always wear your seat belt while driving or riding in a vehicle. Do not drive: If you have been drinking alcohol. Do not ride with someone who has been drinking. When you are tired or distracted. While texting. If you have been using any mind-altering substances or drugs. Wear a helmet and other protective equipment during sports activities. If you have firearms in your house, make sure you follow all gun safety procedures. Seek help if you have been physically or sexually abused. What's next? Visit your health care provider once a year for an annual wellness visit. Ask your health care provider how often you should have your eyes and teeth checked. Stay up to date on all vaccines. This information is not intended to replace advice given to you by your health care provider. Make sure you discuss any questions you have with your health care provider. Document Revised: 10/30/2020 Document Reviewed: 10/30/2020 Elsevier Patient Education  [place].

## 2021-04-08 NOTE — Addendum Note (Signed)
Addended by: Consuella Lose on: 04/08/2021 03:15 PM   Modules accepted: Orders

## 2021-04-08 NOTE — Assessment & Plan Note (Signed)
Doing well on omeprazole 40 mg, continue same.  

## 2021-04-08 NOTE — Assessment & Plan Note (Signed)
Discussed the importance of a healthy diet and regular exercise in order for weight loss, and to reduce the risk of further co-morbidity.  Lipid panel pending.

## 2021-04-08 NOTE — Progress Notes (Signed)
Subjective:    Patient ID: Christina Bell, female    DOB: [birth year], 55 y.o.   MRN: [id number]  HPI  Christina Bell is a very pleasant 55 y.o. female who presents today for complete physical and follow up of chronic conditions.  Immunizations: -Tetanus: 2020 -Influenza: Due today -Covid-19: 2 vaccines -Shingles: Never completed    Diet: Fair diet.  Exercise: No regular exercise, active at work.   Eye exam: No recent visit Dental exam: Completes semi-annually   Pap Smear: Completed in 2021 Mammogram: Completed in 2020 Colonoscopy: Completed in 2015, due 2025 per patient  Lung Cancer Screening: Never completed. Smoker since the age of 46, smokes 3/4 pack per day. Declines.   BP Readings from Last 3 Encounters:  04/08/21 132/74  04/04/20 118/70  03/15/20 (!) 147/81   She is not checking BP at home.     Review of Systems  Constitutional:  Negative for unexpected weight change.  HENT:  Negative for rhinorrhea.   Respiratory:  Negative for shortness of breath.   Cardiovascular:  Negative for chest pain.  Gastrointestinal:  Negative for constipation and diarrhea.  Genitourinary:  Negative for difficulty urinating.  Musculoskeletal:  Positive for arthralgias.  Skin:  Negative for rash.  Allergic/Immunologic: Negative for environmental allergies.  Neurological:  Negative for dizziness, numbness and headaches.  Psychiatric/Behavioral:  The patient is not nervous/anxious.         Past Medical History:  Diagnosis Date   Acute neck pain 11/12/2017   Barrett esophagus    COVID-19 virus infection 12/18/2020   GERD (gastroesophageal reflux disease)    H. pylori infection    Heart murmur    Hypertension     Social History   Socioeconomic History   Marital status: Married    Spouse name: Not on file   Number of children: Not on file   Years of education: Not on file   Highest education level: Not on file  Occupational History   Not on file  Tobacco Use    Smoking status: Every Day    Packs/day: 0.50    Years: 25.00    Pack years: 12.50    Types: Cigarettes   Smokeless tobacco: Never  Substance and Sexual Activity   Alcohol use: Yes    Alcohol/week: 2.0 standard drinks    Types: 2 Standard drinks or equivalent per week   Drug use: Not on file   Sexual activity: Not on file  Other Topics Concern   Not on file  Social History Narrative   Married.   3 children.   Worked as a Runner, broadcasting/film/video.    Enjoys waking, bicycling, driving.   Social Determinants of Health   Financial Resource Strain: Not on file  Food Insecurity: Not on file  Transportation Needs: Not on file  Physical Activity: Not on file  Stress: Not on file  Social Connections: Not on file  Intimate Partner Violence: Not on file    Past Surgical History:  Procedure Laterality Date   BREAST BIOPSY Right    duct removed- neg   BREAST BIOPSY Left 05/02/2019   [name] bx/ribbon clip   BREAST BIOPSY Right 05/02/2019   [name] bx/ coil clip   CESAREAN SECTION     CHOLECYSTECTOMY     ECTOPIC PREGNANCY SURGERY      Family History  Problem Relation Age of Onset   Hypertension Mother    Lung cancer Maternal Grandmother    Breast cancer Neg Hx     Allergies  Allergen Reactions   Morphine And Related Hives    Current Outpatient Medications on File Prior to Visit  Medication Sig Dispense Refill   amLODipine (NORVASC) 10 MG tablet TAKE 1 TABLET BY MOUTH EVERY DAY 90 tablet 3   hydrochlorothiazide (HYDRODIURIL) 25 MG tablet TAKE 1 TABLET BY MOUTH EVERY DAY FOR BLOOD PRESSURE 90 tablet 1   metoprolol succinate (TOPROL-XL) 50 MG 24 hr tablet Take 1 tablet (50 mg total) by mouth daily. For blood pressure. Office visit required for further refills. 90 tablet 0   omeprazole (PRILOSEC) 40 MG capsule Take 1 capsule (40 mg total) by mouth daily. For heartburn 90 capsule 3   No current facility-administered medications on file prior to visit.    BP 132/74   Pulse 79   Temp 98 F (36.7  C)   Resp 12   Ht 5\' 2"  (1.575 m)   Wt 144 lb 6 oz (65.5 kg)   SpO2 97%   BMI 26.41 kg/m  Objective:   Physical Exam HENT:     Right Ear: Tympanic membrane and ear canal normal.     Left Ear: Tympanic membrane and ear canal normal.     Nose: Nose normal.  Eyes:     Conjunctiva/sclera: Conjunctivae normal.     Pupils: Pupils are equal, round, and reactive to light.  Neck:     Thyroid: No thyromegaly.  Cardiovascular:     Rate and Rhythm: Normal rate and regular rhythm.     Heart sounds: No murmur heard. Pulmonary:     Effort: Pulmonary effort is normal.     Breath sounds: Normal breath sounds. No rales.  Abdominal:     General: Bowel sounds are normal.     Palpations: Abdomen is soft.     Tenderness: There is no abdominal tenderness.  Musculoskeletal:        General: Normal range of motion.     Cervical back: Neck supple.  Lymphadenopathy:     Cervical: No cervical adenopathy.  Skin:    General: Skin is warm and dry.     Findings: No rash.  Neurological:     Mental Status: She is alert and oriented to person, place, and time.     Cranial Nerves: No cranial nerve deficit.     Deep Tendon Reflexes: Reflexes are normal and symmetric.  Psychiatric:        Mood and Affect: Mood normal.          Assessment & Plan:      This visit occurred during the SARS-CoV-2 public health emergency.  Safety protocols were in place, including screening questions prior to the visit, additional usage of staff PPE, and extensive cleaning of exam room while observing appropriate contact time as indicated for disinfecting solutions.

## 2021-04-08 NOTE — Assessment & Plan Note (Signed)
Repeat LFT's pending.  Consider ultrasound if still elevated.

## 2021-04-16 NOTE — Telephone Encounter (Signed)
Mrs. Christina Bell called in and stated that she is needing her biometric form completed and today is the last day to turn it in.   Please advise

## 2021-05-01 ENCOUNTER — Other Ambulatory Visit: Payer: Self-pay | Admitting: Primary Care

## 2021-05-01 DIAGNOSIS — I10 Essential (primary) hypertension: Secondary | ICD-10-CM

## 2021-05-05 ENCOUNTER — Other Ambulatory Visit: Payer: Self-pay | Admitting: Primary Care

## 2021-05-05 DIAGNOSIS — K219 Gastro-esophageal reflux disease without esophagitis: Secondary | ICD-10-CM

## 2021-05-13 IMAGING — US US BREAST*L* LIMITED INC AXILLA
1 series · 6 of 6 positions shown · non-contrast
Comparison: Screening study dated 03/23/2019.

CLINICAL DATA: Screening recall for possible masses in each breast,
with the mass on the right associated with calcifications.

EXAM:
DIGITAL DIAGNOSTIC RIGHT MAMMOGRAM WITH CAD AND TOMO
ULTRASOUND BILATERAL BREAST

[Series 1: us breast*left* limited inc axilla · 0.04mm/px · 6 of 6 slices shown]
[im 1/6]
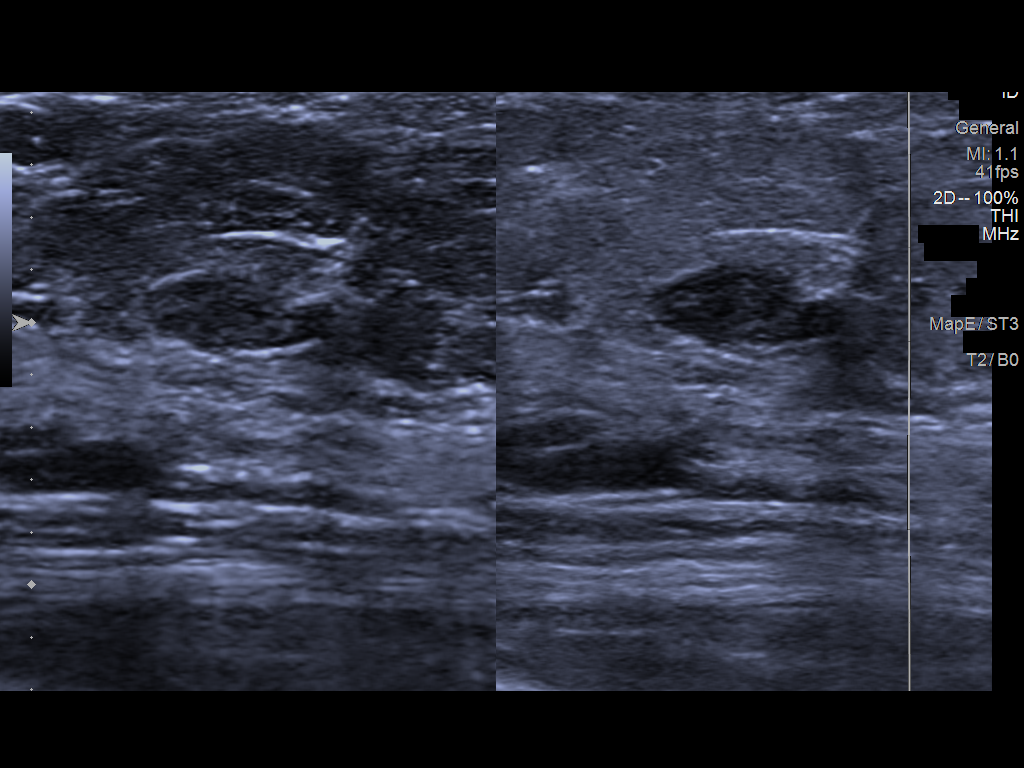
[im 2/6]
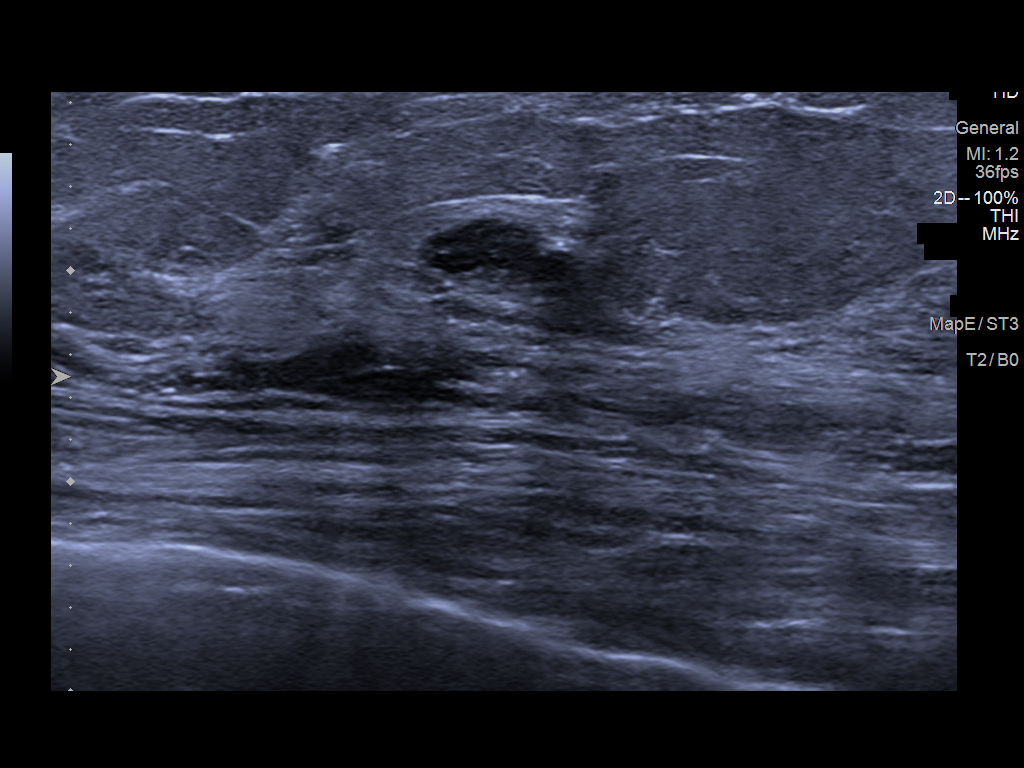
[im 3/6]
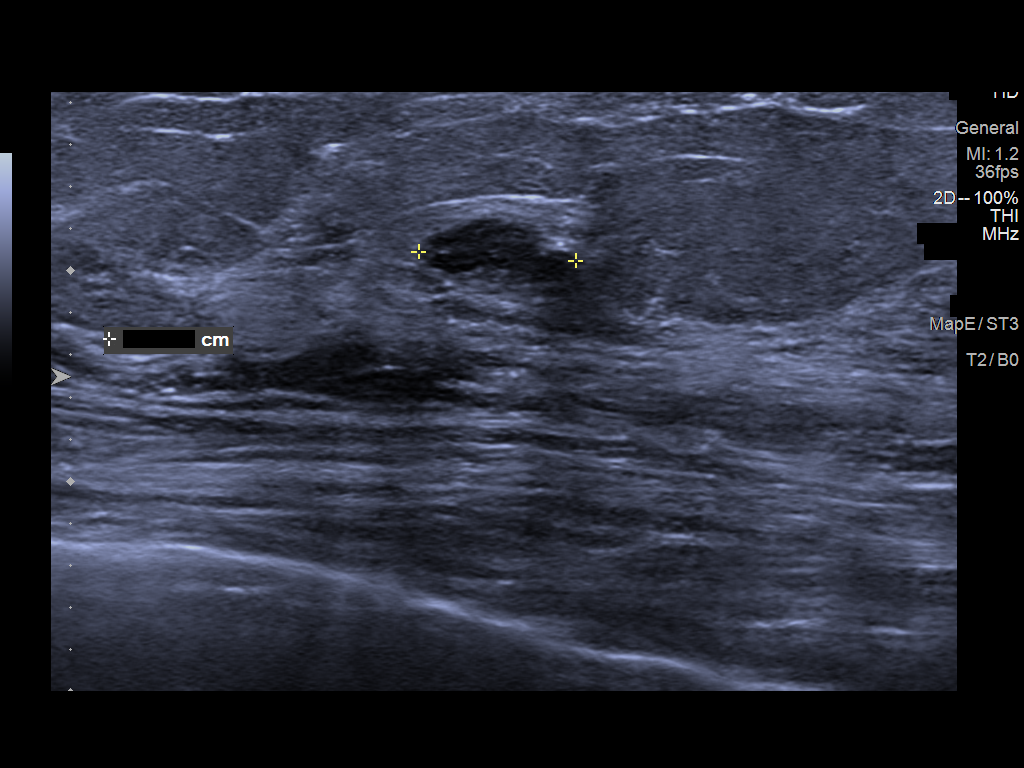
[im 4/6]
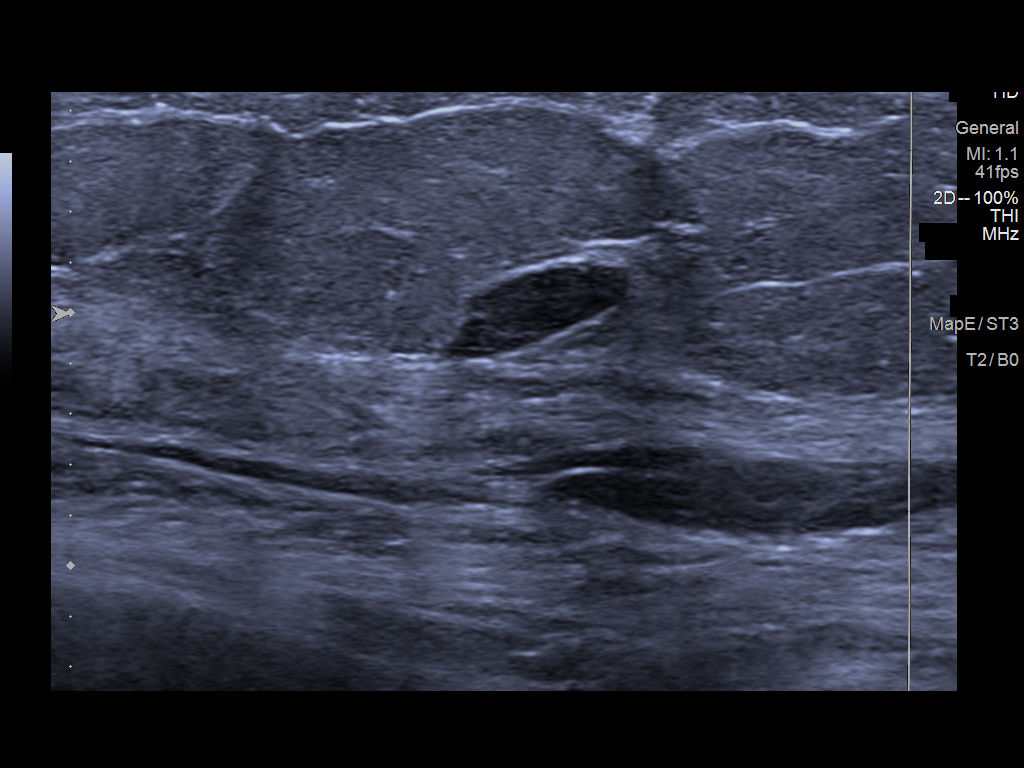
[im 5/6]
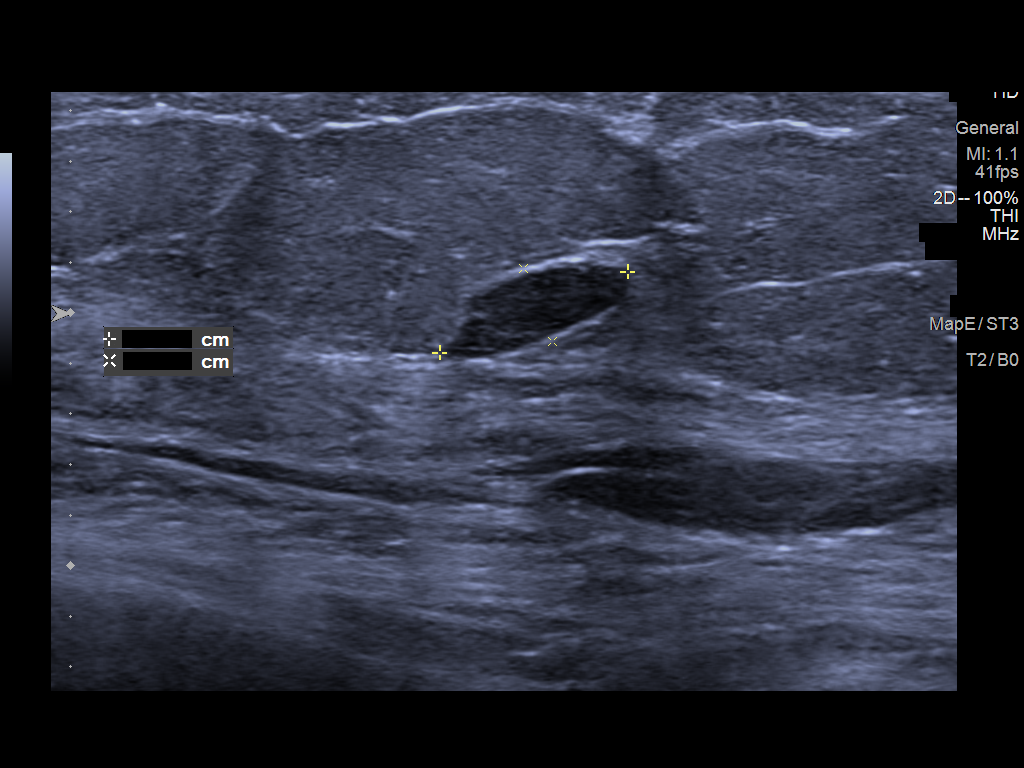
[im 6/6]
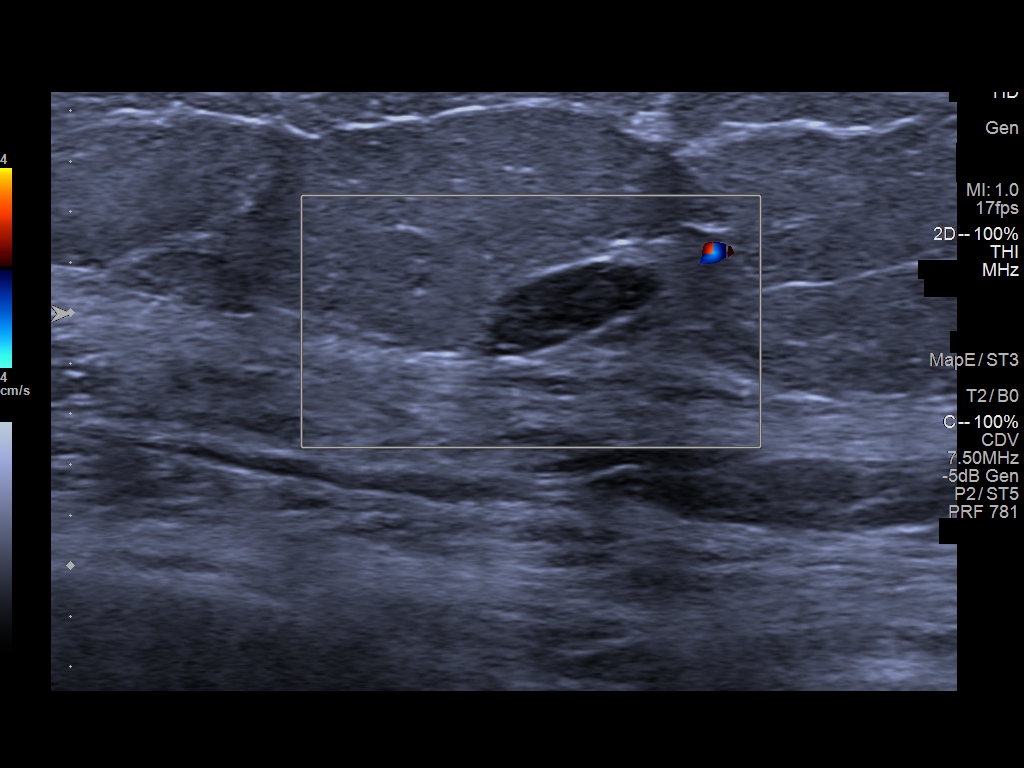

[6 of 6 positions shown; findings below may reference images not displayed]

There are no older
prior studies available.

ACR Breast Density Category b: There are scattered areas of
fibroglandular density.
FINDINGS: In the right breast, possible mass persists on spot compression
imaging. There are associated coarse calcifications at are arranged
peripherally along mass consistent with dystrophic calcifications.
Mass margins are partly ill-defined. Mass measures 7-8 mm in size.
Mass lies inferiorly at 6 o'clock.

In the left breast, possible mass noted on the screening study is
oval and circumscribed lying medially, 7 mm in length.

Mammographic images were processed with CAD.

On physical exam, patient has a scar in the right breast superiorly
from previous duct resection, years ago. No palpable masses.

Targeted right breast ultrasound is performed, showing a hypoechoic
oval mass with partly circumscribed margins, at 6 o'clock, 4 cm the
nipple, measuring 8 x 6 x 7 mm. Echogenic foci along the periphery
reflect the coarse calcifications noted mammographically.

Targeted left breast ultrasound is performed, showing a hypoechoic
oval circumscribed mass at 9:30 o'clock, 4 cm the nipple, measuring
8 x 3 x 8 mm, consistent in size, shape and location to the
mammographic finding.
IMPRESSION: 1. Probably benign bilateral breast masses, most likely
fibroadenomas, the right associated with dystrophic calcifications.
Short-term follow-up is recommended.

RECOMMENDATION:
Bilateral breast ultrasound in 6 months to reassess breast masses.

I have discussed the findings and recommendations with the patient.
If applicable, a reminder letter will be sent to the patient
regarding the next appointment.

BI-RADS CATEGORY  3: Probably benign.

## 2021-05-13 IMAGING — MG MM DIGITAL DIAGNOSTIC UNILAT*R* W/ TOMO W/ CAD
4 series · 4 of 8 positions shown · non-contrast
Comparison: Screening study dated 03/23/2019.

CLINICAL DATA: Screening recall for possible masses in each breast,
with the mass on the right associated with calcifications.

EXAM:
DIGITAL DIAGNOSTIC RIGHT MAMMOGRAM WITH CAD AND TOMO
ULTRASOUND BILATERAL BREAST

[R CC]
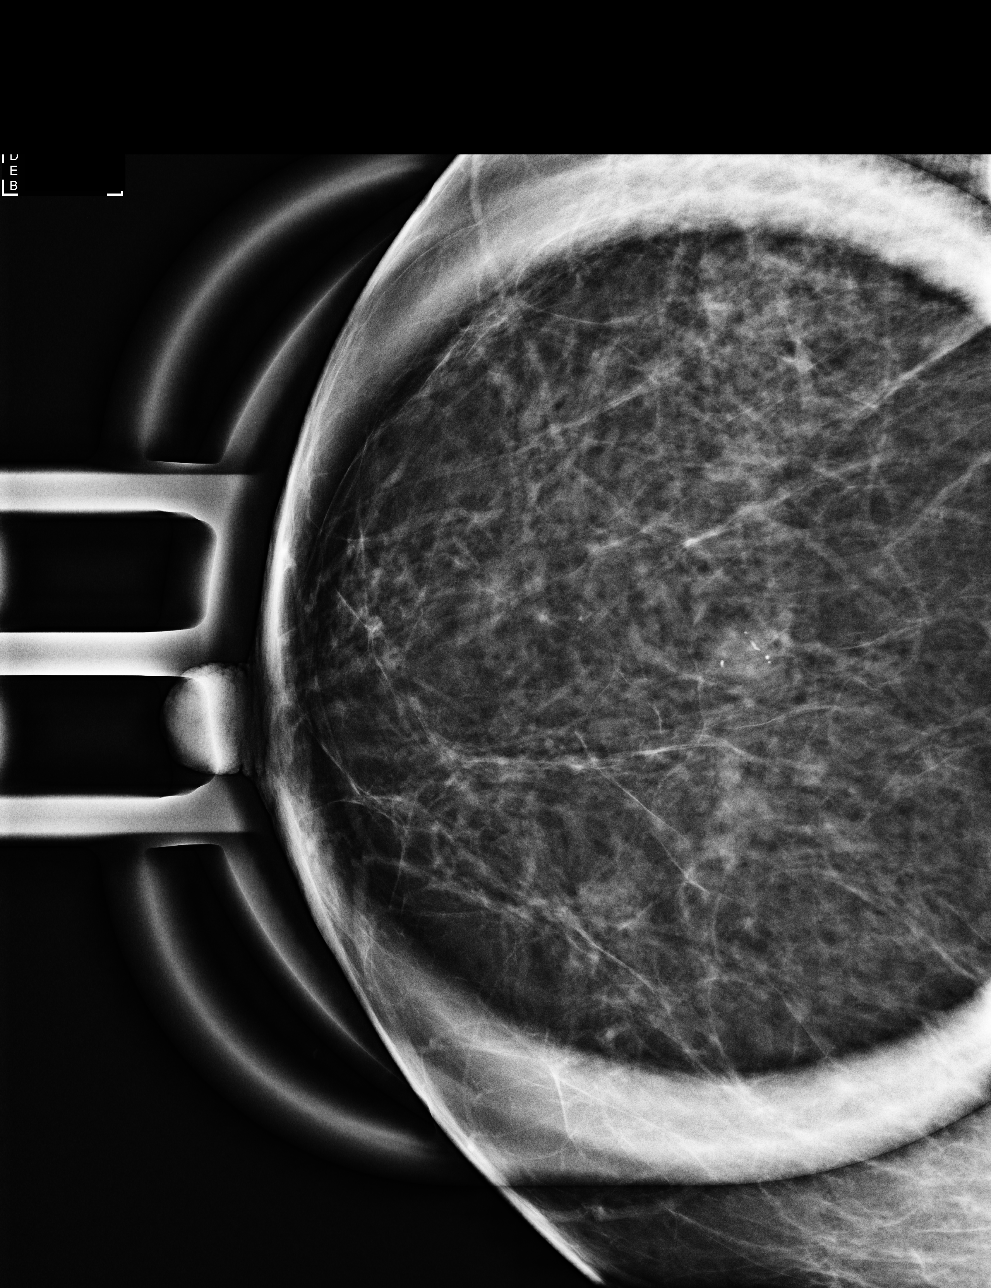

[R ML]
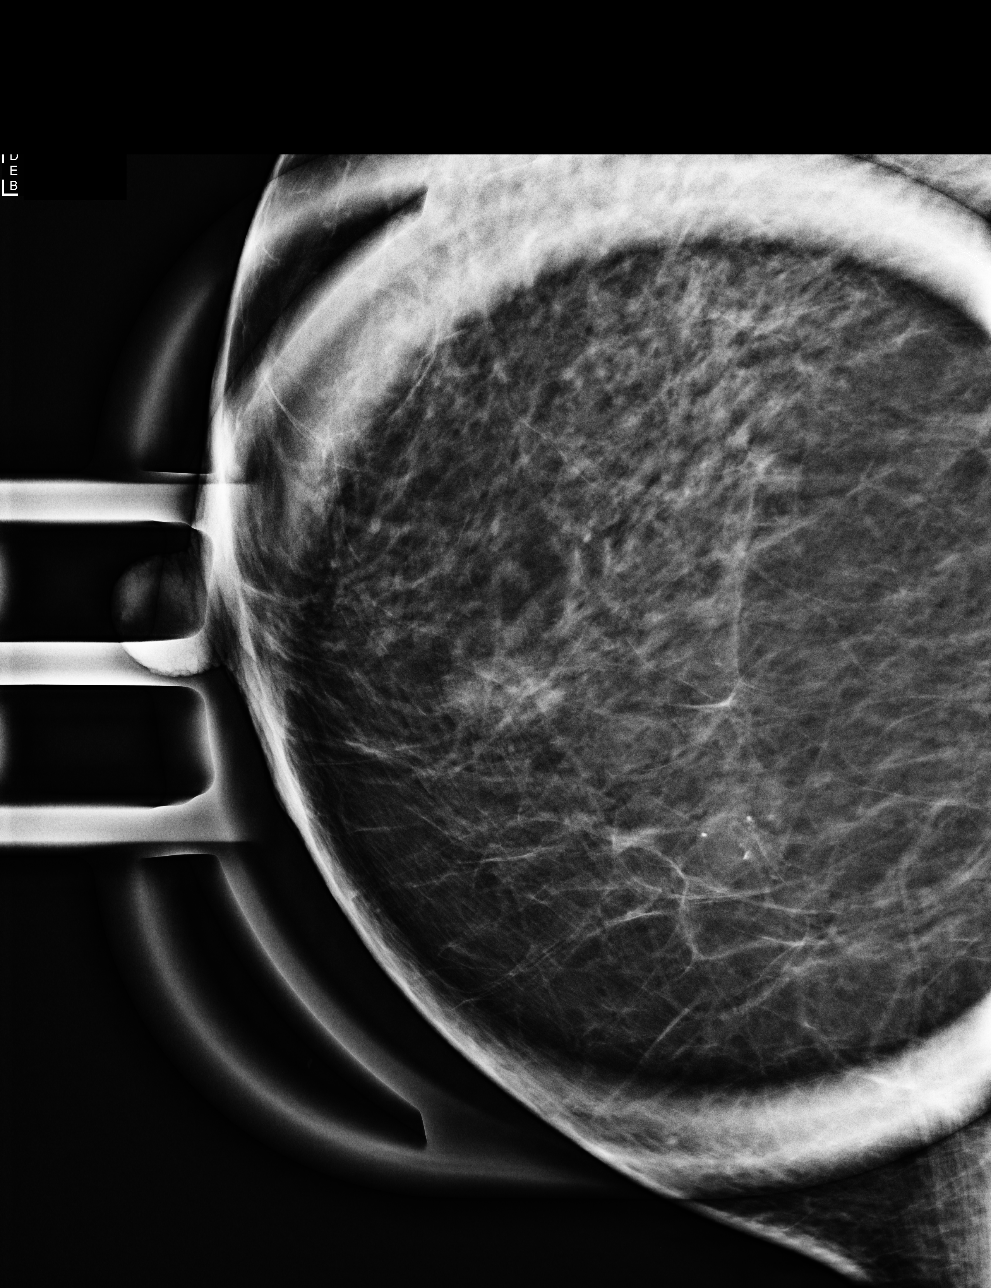

[R ML synth-2D]
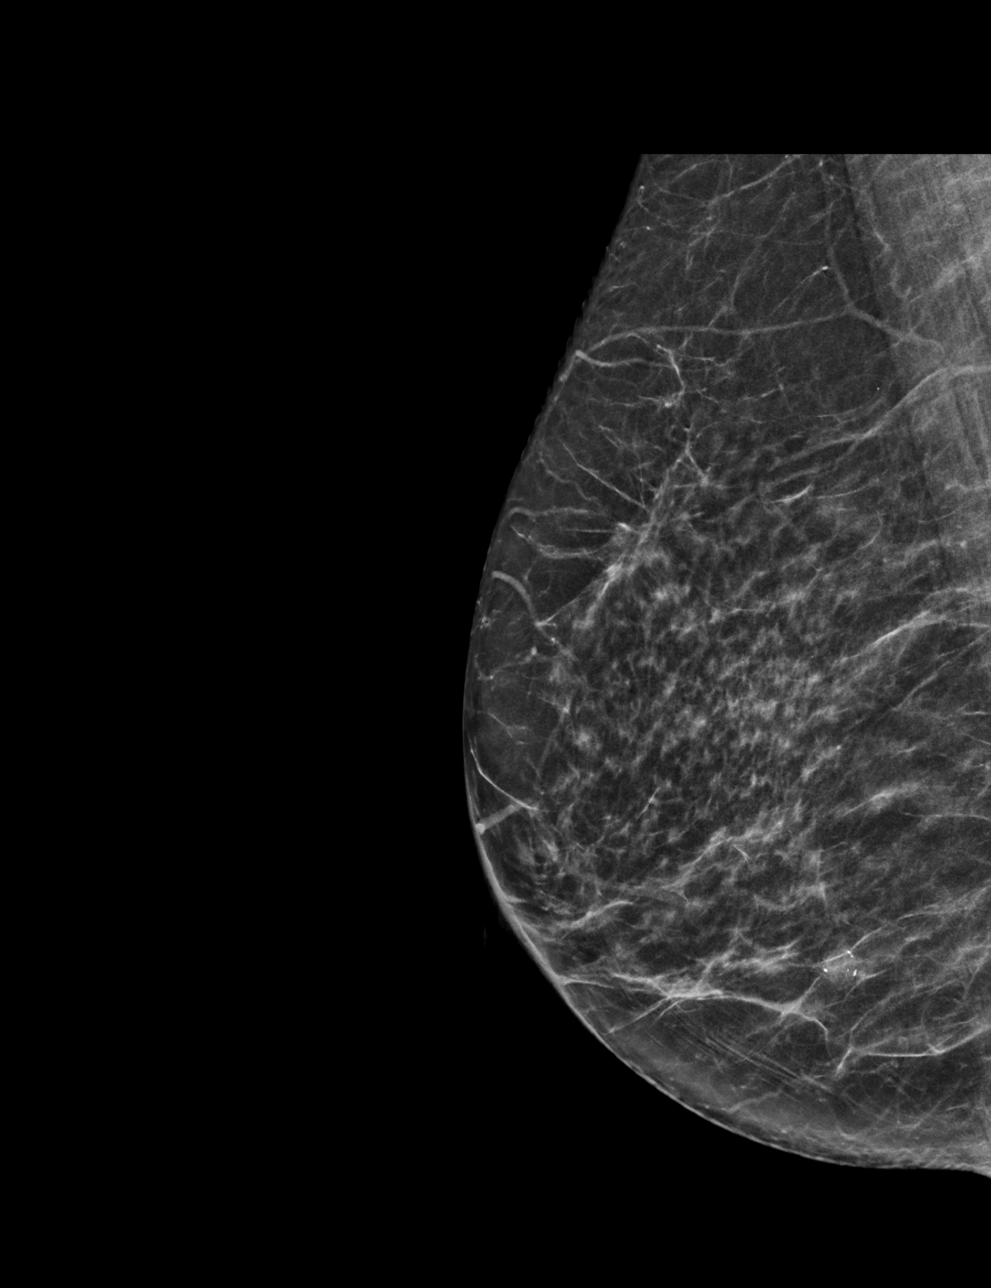

[R ML tomo · tomo slice 27/54.0]
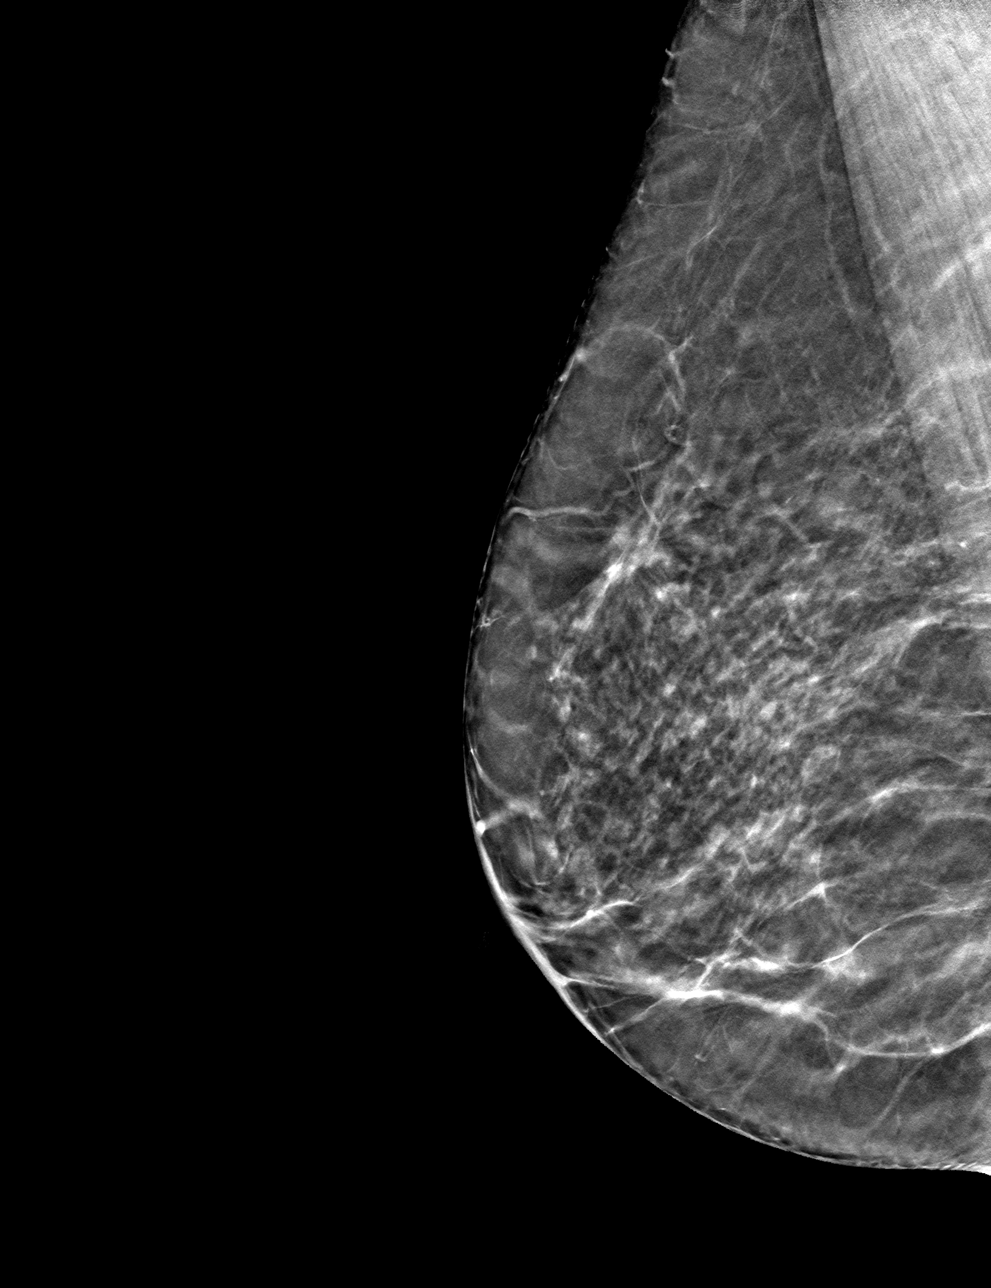

[4 of 8 positions shown; findings below may reference images not displayed]

There are no older
prior studies available.

ACR Breast Density Category b: There are scattered areas of
fibroglandular density.
FINDINGS: In the right breast, possible mass persists on spot compression
imaging. There are associated coarse calcifications at are arranged
peripherally along mass consistent with dystrophic calcifications.
Mass margins are partly ill-defined. Mass measures 7-8 mm in size.
Mass lies inferiorly at 6 o'clock.

In the left breast, possible mass noted on the screening study is
oval and circumscribed lying medially, 7 mm in length.

Mammographic images were processed with CAD.

On physical exam, patient has a scar in the right breast superiorly
from previous duct resection, years ago. No palpable masses.

Targeted right breast ultrasound is performed, showing a hypoechoic
oval mass with partly circumscribed margins, at 6 o'clock, 4 cm the
nipple, measuring 8 x 6 x 7 mm. Echogenic foci along the periphery
reflect the coarse calcifications noted mammographically.

Targeted left breast ultrasound is performed, showing a hypoechoic
oval circumscribed mass at 9:30 o'clock, 4 cm the nipple, measuring
8 x 3 x 8 mm, consistent in size, shape and location to the
mammographic finding.
IMPRESSION: 1. Probably benign bilateral breast masses, most likely
fibroadenomas, the right associated with dystrophic calcifications.
Short-term follow-up is recommended.

RECOMMENDATION:
Bilateral breast ultrasound in 6 months to reassess breast masses.

I have discussed the findings and recommendations with the patient.
If applicable, a reminder letter will be sent to the patient
regarding the next appointment.

BI-RADS CATEGORY  3: Probably benign.

## 2021-06-02 ENCOUNTER — Other Ambulatory Visit: Payer: Self-pay | Admitting: Primary Care

## 2021-06-02 DIAGNOSIS — I10 Essential (primary) hypertension: Secondary | ICD-10-CM

## 2021-08-01 ENCOUNTER — Ambulatory Visit (INDEPENDENT_AMBULATORY_CARE_PROVIDER_SITE_OTHER): Payer: Commercial Managed Care - PPO | Admitting: Family Medicine

## 2021-08-01 ENCOUNTER — Other Ambulatory Visit: Payer: Self-pay

## 2021-08-01 ENCOUNTER — Encounter: Payer: Self-pay | Admitting: Family Medicine

## 2021-08-01 VITALS — BP 124/70 | HR 80 | Temp 98.8°F | Ht 62.5 in | Wt 148.6 lb

## 2021-08-01 DIAGNOSIS — J3089 Other allergic rhinitis: Secondary | ICD-10-CM

## 2021-08-01 DIAGNOSIS — J3489 Other specified disorders of nose and nasal sinuses: Secondary | ICD-10-CM | POA: Insufficient documentation

## 2021-08-01 HISTORY — DX: Other specified disorders of nose and nasal sinuses: J34.89

## 2021-08-01 MED ORDER — MONTELUKAST SODIUM 10 MG PO TABS: 10.0000 mg | ORAL_TABLET | Freq: Every day | ORAL | 3 refills | Status: DC

## 2021-08-01 MED ORDER — MUPIROCIN 2 % EX OINT: 1.0000 "application " | TOPICAL_OINTMENT | Freq: Two times a day (BID) | CUTANEOUS | 0 refills | Status: DC

## 2021-08-01 NOTE — Assessment & Plan Note (Addendum)
New, poor control  ? ?Possible subacute/chronic staph infeciton in nonhealing wound from chronic nasal irritation. ? ?Treat with topical mupirocin ointment twice daily  ?

## 2021-08-01 NOTE — Patient Instructions (Signed)
Apply topical antibiotic ointment to sore in left nostril. ? Start Singulair at night... if not improving add flonase 2 sprays per nostril daily. ? Call if not improving as expected. ?

## 2021-08-01 NOTE — Progress Notes (Signed)
? ? Patient ID: Christina Bell, female    DOB: [birth year], 56 y.o.   MRN: [id number] ? ?This visit was conducted in person. ? ?BP 124/70   Pulse 80   Temp 98.8 ?F (37.1 ?C)   Ht 5' 2.5" (1.588 m)   Wt 148 lb 9.6 oz (67.4 kg)   SpO2 (!) 80%   BMI 26.75 kg/m?   ? ?CC: ?Chief Complaint  ?Patient presents with  ? Sinus Problem  ?  Allergies /sinus , using otc  medication hasn't help, running nose, icthing eyes , watery eyes , no fever , no coughing   ? ? ?Subjective:  ? ?HPI: ?Christina Bell is a 56 y.o. female presenting on 08/01/2021 for Sinus Problem (Allergies /sinus , using otc  medication hasn't help, running nose, icthing eyes , watery eyes , no fever , no coughing ) ? ?She  reports new onset allergy symptoms in last 2 months. ? Allergies much worse this year. ? ? She is around mold/mildew at work. ? ? Runny nose, eyes watery, nasal congestion, Sore in nose, left nostril ?Eyes itchy and watering.. later in day they are red. ? ? Using Vaseline, triple antibiotic ointment. ? No  nasal sprays.. she has tried saline , zyrtec, allegra and Xyzal. ? Benadryl helps with symptoms but makes her sleep. ? ? No SOB, no wheeze. ?   ? ?Relevant past medical, surgical, family and social history reviewed and updated as indicated. Interim medical history since our last visit reviewed. ?Allergies and medications reviewed and updated. ?Outpatient Medications Prior to Visit  ?Medication Sig Dispense Refill  ? amLODipine (NORVASC) 10 MG tablet Take 1 tablet (10 mg total) by mouth daily. For blood pressure. 90 tablet 3  ? hydrochlorothiazide (HYDRODIURIL) 25 MG tablet TAKE 1 TABLET BY MOUTH EVERY DAY FOR BLOOD PRESSURE 90 tablet 3  ? metoprolol succinate (TOPROL-XL) 50 MG 24 hr tablet Take 1 tablet (50 mg total) by mouth daily. For blood pressure. 90 tablet 2  ? omeprazole (PRILOSEC) 40 MG capsule TAKE 1 CAPSULE (40 MG TOTAL) BY MOUTH DAILY. FOR HEARTBURN 90 capsule 3  ? ?No facility-administered medications prior to visit.  ?   ? ?Per HPI unless specifically indicated in ROS section below ?Review of Systems  ?Constitutional:  Negative for fatigue and fever.  ?HENT:  Positive for congestion. Negative for ear pain.   ?Eyes:  Positive for discharge, redness and itching. Negative for pain.  ?Respiratory:  Negative for chest tightness and shortness of breath.   ?Cardiovascular:  Negative for chest pain, palpitations and leg swelling.  ?Gastrointestinal:  Negative for abdominal pain.  ?Genitourinary:  Negative for dysuria.  ?Objective:  ?BP 124/70   Pulse 80   Temp 98.8 ?F (37.1 ?C)   Ht 5' 2.5" (1.588 m)   Wt 148 lb 9.6 oz (67.4 kg)   SpO2 (!) 80%   BMI 26.75 kg/m?   ?Wt Readings from Last 3 Encounters:  ?08/01/21 148 lb 9.6 oz (67.4 kg)  ?04/08/21 144 lb 6 oz (65.5 kg)  ?12/18/20 141 lb 2 oz (64 kg)  ?  ?  ?Physical Exam ?Constitutional:   ?   General: She is not in acute distress. ?   Appearance: Normal appearance. She is well-developed. She is not ill-appearing or toxic-appearing.  ?HENT:  ?   Head: Normocephalic.  ?   Right Ear: Hearing, tympanic membrane, ear canal and external ear normal. Tympanic membrane is not erythematous, retracted or bulging.  ?   Left Ear:  Hearing, tympanic membrane, ear canal and external ear normal. Tympanic membrane is not erythematous, retracted or bulging.  ?   Nose: Nasal tenderness, mucosal edema and rhinorrhea present.  ?   Right Turbinates: Swollen and pale.  ?   Left Turbinates: Swollen and pale.  ?   Right Sinus: No maxillary sinus tenderness or frontal sinus tenderness.  ?   Left Sinus: No maxillary sinus tenderness or frontal sinus tenderness.  ?   Comments:  Small 0.5 cm scab over erythematous are on nostril anteriomedial. Normal septum ?   Mouth/Throat:  ?   Pharynx: Uvula midline.  ?Eyes:  ?   General: Lids are normal. Lids are everted, no foreign bodies appreciated.  ?   Conjunctiva/sclera: Conjunctivae normal.  ?   Pupils: Pupils are equal, round, and reactive to light.  ?Neck:  ?    Thyroid: No thyroid mass or thyromegaly.  ?   Vascular: No carotid bruit.  ?   Trachea: Trachea normal.  ?Cardiovascular:  ?   Rate and Rhythm: Normal rate and regular rhythm.  ?   Pulses: Normal pulses.  ?   Heart sounds: Normal heart sounds, S1 normal and S2 normal. No murmur heard. ?  No friction rub. No gallop.  ?Pulmonary:  ?   Effort: Pulmonary effort is normal. No tachypnea or respiratory distress.  ?   Breath sounds: Normal breath sounds. No decreased breath sounds, wheezing, rhonchi or rales.  ?Abdominal:  ?   General: Bowel sounds are normal.  ?   Palpations: Abdomen is soft.  ?   Tenderness: There is no abdominal tenderness.  ?Musculoskeletal:  ?   Cervical back: Normal range of motion and neck supple.  ?Skin: ?   General: Skin is warm and dry.  ?   Findings: No rash.  ?Neurological:  ?   Mental Status: She is alert.  ?Psychiatric:     ?   Mood and Affect: Mood is not anxious or depressed.     ?   Speech: Speech normal.     ?   Behavior: Behavior normal. Behavior is cooperative.     ?   Thought Content: Thought content normal.     ?   Judgment: Judgment normal.  ? ?   ?Results for orders placed or performed in visit on 04/08/21  ?Lipid panel  ?Result Value Ref Range  ? Cholesterol 223 (H) 0 - 200 mg/dL  ? Triglycerides 44.0 0.0 - 149.0 mg/dL  ? HDL 95.90 >39.00 mg/dL  ? VLDL 8.8 0.0 - 40.0 mg/dL  ? LDL Cholesterol 119 (H) 0 - 99 mg/dL  ? Total CHOL/HDL Ratio 2   ? NonHDL 127.51   ?Hemoglobin A1c  ?Result Value Ref Range  ? Hgb A1c MFr Bld 5.3 4.6 - 6.5 %  ?Comprehensive metabolic panel  ?Result Value Ref Range  ? Sodium 136 135 - 145 mEq/L  ? Potassium 3.6 3.5 - 5.1 mEq/L  ? Chloride 99 96 - 112 mEq/L  ? CO2 28 19 - 32 mEq/L  ? Glucose, Bld 87 70 - 99 mg/dL  ? BUN 12 6 - 23 mg/dL  ? Creatinine, Ser 0.58 0.40 - 1.20 mg/dL  ? Total Bilirubin 1.1 0.2 - 1.2 mg/dL  ? Alkaline Phosphatase 62 39 - 117 U/L  ? AST 27 0 - 37 U/L  ? ALT 19 0 - 35 U/L  ? Total Protein 7.5 6.0 - 8.3 g/dL  ? Albumin 4.6 3.5 - 5.2 g/dL   ? GFR  101.10 >60.00 mL/min  ? Calcium 10.0 8.4 - 10.5 mg/dL  ?CBC  ?Result Value Ref Range  ? WBC 7.2 4.0 - 10.5 K/uL  ? RBC 4.56 3.87 - 5.11 Mil/uL  ? Platelets 223.0 150.0 - 400.0 K/uL  ? Hemoglobin 14.4 12.0 - 15.0 g/dL  ? HCT 42.6 36.0 - 46.0 %  ? MCV 93.4 78.0 - 100.0 fl  ? MCHC 33.9 30.0 - 36.0 g/dL  ? RDW 12.8 11.5 - 15.5 %  ? ? ?This visit occurred during the SARS-CoV-2 public health emergency.  Safety protocols were in place, including screening questions prior to the visit, additional usage of staff PPE, and extensive cleaning of exam room while observing appropriate contact time as indicated for disinfecting solutions.  ? ?COVID 19 screen:  No recent travel or known exposure to [place] ?The patient denies respiratory symptoms of COVID 19 at this time. ?The importance of social distancing was discussed today.  ? ?Assessment and Plan ? ?  ?Problem List Items Addressed This Visit   ? ? Non-seasonal allergic rhinitis due to fungal spores  ?  Chronic, recent worsening ? ?Failed OTC antihistamine multiple types. ? Will try trial of Singulair at bedtime. ? Can try flonase or call for eye antihistamine if not improving as expected. She wishes to minimize meds at this time. ?  ?  ? Sore in nose - Primary  ?   New, poor control  ? ?Possible subacute/chronic staph infeciton in nonhealing wound from chronic nasal irritation. ? ?Treat with topical mupirocin ointment twice daily  ?  ?  ? ?Meds ordered this encounter  ?Medications  ? mupirocin ointment (BACTROBAN) 2 %  ?  Sig: Apply 1 application. topically 2 (two) times daily.  ?  Dispense:  22 g  ?  Refill:  0  ? montelukast (SINGULAIR) 10 MG tablet  ?  Sig: Take 1 tablet (10 mg total) by mouth at bedtime.  ?  Dispense:  30 tablet  ?  Refill:  3  ? ? ? ?Eliezer Lofts, MD  ? ?

## 2021-08-01 NOTE — Assessment & Plan Note (Signed)
Chronic, recent worsening ? ?Failed OTC antihistamine multiple types. ? Will try trial of Singulair at bedtime. ? Can try flonase or call for eye antihistamine if not improving as expected. She wishes to minimize meds at this time. ?

## 2021-09-23 ENCOUNTER — Ambulatory Visit (INDEPENDENT_AMBULATORY_CARE_PROVIDER_SITE_OTHER): Payer: Commercial Managed Care - PPO | Admitting: Family

## 2021-09-23 ENCOUNTER — Encounter: Payer: Self-pay | Admitting: Family

## 2021-09-23 ENCOUNTER — Other Ambulatory Visit: Payer: Self-pay

## 2021-09-23 VITALS — BP 134/62 | HR 91 | Temp 98.9°F | Resp 16 | Ht 62.5 in | Wt 150.1 lb

## 2021-09-23 DIAGNOSIS — N390 Urinary tract infection, site not specified: Secondary | ICD-10-CM

## 2021-09-23 DIAGNOSIS — N3000 Acute cystitis without hematuria: Secondary | ICD-10-CM | POA: Insufficient documentation

## 2021-09-23 DIAGNOSIS — R300 Dysuria: Secondary | ICD-10-CM | POA: Insufficient documentation

## 2021-09-23 HISTORY — DX: Acute cystitis without hematuria: N30.00

## 2021-09-23 LAB — POCT URINALYSIS DIP (CLINITEK)
Bilirubin, UA: NEGATIVE
Blood, UA: NEGATIVE
Glucose, UA: NEGATIVE mg/dL
Ketones, POC UA: NEGATIVE mg/dL
Nitrite, UA: NEGATIVE
Spec Grav, UA: 1.02 (ref 1.010–1.025)
Urobilinogen, UA: 0.2 E.U./dL
pH, UA: 6 (ref 5.0–8.0)

## 2021-09-23 MED ORDER — SULFAMETHOXAZOLE-TRIMETHOPRIM 800-160 MG PO TABS: 1.0000 | ORAL_TABLET | Freq: Two times a day (BID) | ORAL | 0 refills | Status: AC

## 2021-09-23 MED ORDER — PHENAZOPYRIDINE HCL 200 MG PO TABS: 200.0000 mg | ORAL_TABLET | Freq: Three times a day (TID) | ORAL | 0 refills | Status: AC | PRN

## 2021-09-23 MED ORDER — AMOXICILLIN-POT CLAVULANATE 875-125 MG PO TABS: 1.0000 | ORAL_TABLET | Freq: Two times a day (BID) | ORAL | 0 refills | Status: DC

## 2021-09-23 NOTE — Assessment & Plan Note (Signed)
antbx sent to pharmacy, pt to take as directed. Encouraged increased water intake throughout the day. Urine culture/reflex pending results. Choosing to treat due to being symptomatic. If no improvement in the next 2 days pt advised to let me know.  

## 2021-09-23 NOTE — Assessment & Plan Note (Signed)
poct urine dip in office Urine culture ordered pending results  

## 2021-09-23 NOTE — Progress Notes (Signed)
? ?Established Patient Office Visit ? ?Subjective:  ?Patient ID: Christina Bell, female    DOB: 23-Dec-1965  Age: 56 y.o. MRN: [id number] ? ?CC:  ?Chief Complaint  ?Patient presents with  ? Urinary Tract Infection  ?  X 2 days  ? ? ?HPI ?Christina Bell is here today with concerns.  ? ?Two days of dysuria, urinary urgency and frequency, worsening ?Taking cranberry juice.  ?No fever no chills ?No flank pain.  ?Pelvic pain , mild , yesterday. ?Took ibuprofen with mild relief.  ? ?Past Medical History:  ?Diagnosis Date  ? Acute neck pain 11/12/2017  ? Barrett esophagus   ? COVID-19 virus infection 12/18/2020  ? GERD (gastroesophageal reflux disease)   ? H. pylori infection   ? Heart murmur   ? Hypertension   ? ? ?Past Surgical History:  ?Procedure Laterality Date  ? BREAST BIOPSY Right   ? duct removed- neg  ? BREAST BIOPSY Left 05/02/2019  ? [name] bx/ribbon clip  ? BREAST BIOPSY Right 05/02/2019  ? [name] bx/ coil clip  ? CESAREAN SECTION    ? CHOLECYSTECTOMY    ? ECTOPIC PREGNANCY SURGERY    ? ? ?Family History  ?Problem Relation Age of Onset  ? Hypertension Mother   ? Lung cancer Maternal Grandmother   ? Breast cancer Neg Hx   ? ? ?Social History  ? ?Socioeconomic History  ? Marital status: Married  ?  Spouse name: Not on file  ? Number of children: Not on file  ? Years of education: Not on file  ? Highest education level: Not on file  ?Occupational History  ? Not on file  ?Tobacco Use  ? Smoking status: Every Day  ?  Packs/day: 0.50  ?  Years: 25.00  ?  Pack years: 12.50  ?  Types: Cigarettes  ? Smokeless tobacco: Never  ?Substance and Sexual Activity  ? Alcohol use: Yes  ?  Alcohol/week: 2.0 standard drinks  ?  Types: 2 Standard drinks or equivalent per week  ? Drug use: Not on file  ? Sexual activity: Not on file  ?Other Topics Concern  ? Not on file  ?Social History Narrative  ? Married.  ? 3 children.  ? Worked as a Runner, broadcasting/film/video.   ? Enjoys waking, bicycling, driving.  ? ?Social Determinants of Health  ? ?Financial Resource  Strain: Not on file  ?Food Insecurity: Not on file  ?Transportation Needs: Not on file  ?Physical Activity: Not on file  ?Stress: Not on file  ?Social Connections: Not on file  ?Intimate Partner Violence: Not on file  ? ? ?Outpatient Medications Prior to Visit  ?Medication Sig Dispense Refill  ? amLODipine (NORVASC) 10 MG tablet Take 1 tablet (10 mg total) by mouth daily. For blood pressure. 90 tablet 3  ? hydrochlorothiazide (HYDRODIURIL) 25 MG tablet TAKE 1 TABLET BY MOUTH EVERY DAY FOR BLOOD PRESSURE 90 tablet 3  ? metoprolol succinate (TOPROL-XL) 50 MG 24 hr tablet Take 1 tablet (50 mg total) by mouth daily. For blood pressure. 90 tablet 2  ? montelukast (SINGULAIR) 10 MG tablet Take 1 tablet (10 mg total) by mouth at bedtime. 30 tablet 3  ? mupirocin ointment (BACTROBAN) 2 % Apply 1 application. topically 2 (two) times daily. 22 g 0  ? omeprazole (PRILOSEC) 40 MG capsule TAKE 1 CAPSULE (40 MG TOTAL) BY MOUTH DAILY. FOR HEARTBURN 90 capsule 3  ? ?No facility-administered medications prior to visit.  ? ? ?Allergies  ?Allergen Reactions  ? Morphine  And Related Hives  ? ? ?ROS ?Review of Systems  ?Constitutional:  Negative for activity change, appetite change, chills, fatigue and fever.  ?Gastrointestinal:  Negative for abdominal pain.  ?Genitourinary:  Positive for dysuria, frequency, pelvic pain and urgency. Negative for decreased urine volume, difficulty urinating, flank pain, genital sores and hematuria.  ?Neurological:  Negative for dizziness and light-headedness.  ?Psychiatric/Behavioral:  Negative for confusion.   ?All other systems reviewed and are negative. ? ?  ?Objective:  ?  ?Physical Exam ?Constitutional:   ?   General: She is not in acute distress. ?   Appearance: Normal appearance. She is well-developed, well-groomed and normal weight. She is not ill-appearing, toxic-appearing or diaphoretic.  ?HENT:  ?   Mouth/Throat:  ?   Pharynx: No pharyngeal swelling.  ?   Tonsils: No tonsillar exudate.  ?Neck:   ?   Thyroid: No thyroid mass.  ?Pulmonary:  ?   Effort: Pulmonary effort is normal.  ?Abdominal:  ?   Tenderness: There is no abdominal tenderness.  ?Musculoskeletal:  ?   Lumbar back: Normal. No tenderness.  ?   Comments: No flank pain no CVA tenderness  ?Lymphadenopathy:  ?   Cervical:  ?   Right cervical: No superficial cervical adenopathy. ?   Left cervical: No superficial cervical adenopathy.  ?Neurological:  ?   General: No focal deficit present.  ?   Mental Status: She is alert and oriented to person, place, and time. Mental status is at baseline.  ?Psychiatric:     ?   Mood and Affect: Mood normal.     ?   Behavior: Behavior normal.     ?   Thought Content: Thought content normal.     ?   Judgment: Judgment normal.  ? ? ?BP 134/62   Pulse 91   Temp 98.9 ?F (37.2 ?C)   Resp 16   Ht 5' 2.5" (1.588 m)   Wt 150 lb 1 oz (68.1 kg)   SpO2 97%   BMI 27.01 kg/m?  ?Wt Readings from Last 3 Encounters:  ?09/23/21 150 lb 1 oz (68.1 kg)  ?08/01/21 148 lb 9.6 oz (67.4 kg)  ?04/08/21 144 lb 6 oz (65.5 kg)  ? ? ? ?Health Maintenance Due  ?Topic Date Due  ? COVID-19 Vaccine (3 - Booster for Pfizer series) 10/16/2019  ? MAMMOGRAM  03/22/2021  ? Zoster Vaccines- Shingrix (2 of 2) 06/03/2021  ? ? ?There are no preventive care reminders to display for this patient. ? ?No results found for: TSH ?Lab Results  ?Component Value Date  ? WBC 7.2 04/08/2021  ? HGB 14.4 04/08/2021  ? HCT 42.6 04/08/2021  ? MCV 93.4 04/08/2021  ? PLT 223.0 04/08/2021  ? ?Lab Results  ?Component Value Date  ? NA 136 04/08/2021  ? K 3.6 04/08/2021  ? CO2 28 04/08/2021  ? GLUCOSE 87 04/08/2021  ? BUN 12 04/08/2021  ? CREATININE 0.58 04/08/2021  ? BILITOT 1.1 04/08/2021  ? ALKPHOS 62 04/08/2021  ? AST 27 04/08/2021  ? ALT 19 04/08/2021  ? PROT 7.5 04/08/2021  ? ALBUMIN 4.6 04/08/2021  ? CALCIUM 10.0 04/08/2021  ? GFR 101.10 04/08/2021  ? ?Lab Results  ?Component Value Date  ? HGBA1C 5.3 04/08/2021  ? ? ?  ?Assessment & Plan:  ? ?Problem List Items  Addressed This Visit   ? ?  ? Genitourinary  ? Acute cystitis without hematuria  ?  antbx sent to pharmacy, pt to take as directed. Encouraged increased  water intake throughout the day. Urine culture/reflex pending results. Choosing to treat due to being symptomatic. If no improvement in the next 2 days pt advised to let me know. ? ? ?  ?  ? Relevant Medications  ? sulfamethoxazole-trimethoprim (BACTRIM DS) 800-160 MG tablet  ?  ? Other  ? Dysuria - Primary  ?  poct urine dip in office ?Urine culture ordered pending results ? ?  ?  ? Relevant Orders  ? POCT Urinalysis Dipstick (Automated)  ? Urine Culture  ? ? ?Meds ordered this encounter  ?Medications  ? DISCONTD: amoxicillin-clavulanate (AUGMENTIN) 875-125 MG tablet  ?  Sig: Take 1 tablet by mouth 2 (two) times daily.  ?  Dispense:  20 tablet  ?  Refill:  0  ?  Order Specific Question:   Supervising Provider  ?  Answer:   BEDSOLE, AMY E [[id number]]  ? sulfamethoxazole-trimethoprim (BACTRIM DS) 800-160 MG tablet  ?  Sig: Take 1 tablet by mouth 2 (two) times daily for 7 days.  ?  Dispense:  14 tablet  ?  Refill:  0  ?  Order Specific Question:   Supervising Provider  ?  Answer:   BEDSOLE, AMY E [[id number]]  ? ? ?Follow-up: No follow-ups on file.  ? ? ?Mort Sawyers, FNP ?

## 2021-09-23 NOTE — Patient Instructions (Signed)
It was a pleasure seeing you today.   You were found to have a urinary tract infection, you have been prescribed an antibiotic to your preferred pharmacy. Please start antibiotic today as directed.   We are sending your urine for a culture to make sure you do not have a resistant bacteria. We will call you if we need to change your medications.   Please make sure you are drinking plenty of fluids over the next few days.  If your symptoms do not improve over the next 5-7 days, or if they worsen, please let us know. Please also let us know if you have worsening back pain, fevers, chills, or body aches.   Regards,   Charlyne Robertshaw  

## 2021-09-25 LAB — URINE CULTURE
MICRO NUMBER:: 13371354
SPECIMEN QUALITY:: ADEQUATE

## 2021-10-23 ENCOUNTER — Telehealth: Payer: Self-pay | Admitting: Primary Care

## 2021-10-23 NOTE — Telephone Encounter (Signed)
[facility] called and said that the diagnosis codes put in for the appt on 2021-05-03 for pts labs was not covered as preventative but that the appointment was coded as a CPE,  She said the hyperlipidemia e78.5 was marked as the primary diag code for and they wanted to know if it could be reviewed. Callback is [phone] ext [phone] [facility] with [facility]

## 2021-11-27 ENCOUNTER — Ambulatory Visit (INDEPENDENT_AMBULATORY_CARE_PROVIDER_SITE_OTHER): Payer: Commercial Managed Care - PPO | Admitting: Family Medicine

## 2021-11-27 ENCOUNTER — Telehealth: Payer: Self-pay

## 2021-11-27 ENCOUNTER — Other Ambulatory Visit: Payer: Self-pay | Admitting: Family Medicine

## 2021-11-27 ENCOUNTER — Encounter: Payer: Self-pay | Admitting: Family Medicine

## 2021-11-27 VITALS — BP 136/70 | HR 83 | Temp 98.6°F | Ht 62.5 in | Wt 147.0 lb

## 2021-11-27 DIAGNOSIS — M7661 Achilles tendinitis, right leg: Secondary | ICD-10-CM | POA: Diagnosis not present

## 2021-11-27 DIAGNOSIS — J3089 Other allergic rhinitis: Secondary | ICD-10-CM

## 2021-11-27 MED ORDER — NITROGLYCERIN 0.2 MG/HR TD PT24: MEDICATED_PATCH | TRANSDERMAL | 3 refills | Status: DC

## 2021-11-27 NOTE — Telephone Encounter (Signed)
[facility] Day - Client Nonclinical Telephone Record  [facility] Client [facility] Day - Client Client Site [facility] - Day Provider Hannah Beat - MD Contact Type Call Who Is Calling Patient / Member / Family / Caregiver Caller Name Kaedance Magos Caller Phone Number [phone] Patient Name Christina Bell Patient DOB 09-07-1965 Call Type Message Only Information Provided Reason for Call Request for General Office Information Initial Comment Caller needs to get an earlier appt, please call the patient back. Disp. Time Disposition Final User 11/27/2021 8:04:53 AM General Information Provided Yes Donnetta Hutching Call Closed By: Donnetta Hutching Transaction Date/Time: 11/27/2021 8:01:58 AM (ET

## 2021-11-27 NOTE — Patient Instructions (Signed)
Achilles Tendon Rehab  Start easy.  It is ok if there is mild discomfort, but if there is pain, then back off how much rehab you are doing.  For achilles rehab, you basically just need to concentrate on the ankle going up and down.  Start: Calf raises while seated First lower and then raise on both feet Assist lifting with hands and then slowly lower  Begin with 3 sets of 10 repetitions Increase by 5 repetitions every 3 days  Goal is 3 sets of 30 repetitions  If feels good at 3 sets of 30 - add backpack with 5 lbs  Increase by 5 lbs per week to max of 30 lbs   Alternative: If you have weights at home, you can put a dumbbell upright on the knee of the effected heel.  Go up with assistance from your hands, and then lower slowly.  If this is easy, then change to calf lowers standing on a step.  A heel cup of any brand will elevate the heel and take stress off of the achilles tendon. I particularly like the brand called Tuli's heel cups.  They cup the back of the heel, too.  They can be hard to find unless you can order off of the computer like on [employer]. Any heel cup is ok, though, including ones you can find at any pharmacy  

## 2021-11-27 NOTE — Progress Notes (Signed)
Christina Bell T. Mason Burleigh, MD, CAQ Sports Medicine [facility] [address] [place], [address]  Phone: [phone]  FAX: [phone]  Christina Bell - 56 y.o. female  MRN [id number]  Date of Birth: [birth year]  Date: 11/27/2021  PCP: Christina Nest, NP  Referral: Christina Nest, NP  Chief Complaint  Patient presents with   Ankle Pain    Right   Subjective:   Christina Bell is a 56 y.o. very pleasant female patient with Body mass index is 26.46 kg/m. who presents with the following:  Presents with acute ankle pain: She has pain in the posterior aspect of her ankle, she has had a history of Achilles tendinitis and a question of possible injury to the Achilles without full rupture.  She has had a longstanding bump, now this is flared up and is bothering her quite a bit, she also has some pain just lateral to this.  No specific injury, but she does work on her feet all the time in [employer] business.  Review of Systems is noted in the HPI, as appropriate  Objective:   BP 136/70   Pulse 83   Temp 98.6 F (37 C) (Oral)   Ht 5' 2.5" (1.588 m)   Wt 147 lb (66.7 kg)   SpO2 97%   BMI 26.46 kg/m   GEN: No acute distress; alert,appropriate. PULM: Breathing comfortably in no respiratory distress PSYCH: Normally interactive.    Foot: R Echymosis: no Edema: no ROM: full LE B Gait: heel toe, non-antalgic MT pain: no Callus pattern: none Lateral Mall: NT Medial Mall: NT Talus: NT Navicular: NT Cuboid: NT Calcaneous: NT Metatarsals: NT 5th MT: NT Phalanges: NT Achilles: PAINFUL TO PALPATE AT INSERTION ON LEFT, PROMINENT NODULE Plantar Fascia: NT Fat Pad: NT Peroneals: NT Post Tib: NT Great Toe: Nml motion Ant Drawer: neg ATFL: NT CFL: NT Deltoid: NT Sensation: intact   Laboratory and Imaging Data: Diagnostic Ultrasound Evalution: Investment banker, operational scanned: heel Indication: Pain Findings: Large  calcification noted in the Achilles tendon and in about the insertion consistent with calcific tendinitis with large evidence of calcification.  There is also some increased flow with the power flow Doppler in this region. -Imagings were unfortunately deleted by accident, and these were reviewed face-to-face with the patient, but images are not stored. Electronically Signed  By: Christina Beat, MD On: 11/27/2021  3:40 PM EDT  (No charge, given lack of image storing.)  Assessment and Plan:     ICD-10-CM   1. Tendonitis, Achilles, right  M76.61      Chronic calcific Achilles tendinopathy with exacerbation.  Visualized on ultrasound and obvious clinical exam.  Start Christina Bell and Achilles nitroglycerin Bell.  Patient Instructions  Achilles Tendon Rehab  Start easy.  It is ok if there is mild discomfort, but if there is pain, then back off how much rehab you are doing.  For achilles rehab, you basically just need to concentrate on the ankle going up and down.  Start: Calf raises while seated First lower and then raise on both feet Assist lifting with hands and then slowly lower  Begin with 3 sets of 10 repetitions Increase by 5 repetitions every 3 days  Goal is 3 sets of 30 repetitions  If feels good at 3 sets of 30 - add backpack with 5 lbs  Increase by 5 lbs per week to max of 30 lbs   Alternative: If you have weights at  home, you can put a dumbbell upright on the knee of the effected heel.  Go up with assistance from your hands, and then lower slowly.  If this is easy, then change to calf lowers standing on a step.  A heel cup of any brand will elevate the heel and take stress off of the achilles tendon. I particularly like the brand called Tuli's heel cups.  They cup the back of the heel, too.  They can be hard to find unless you can order off of the computer like on [employer]. Any heel cup is ok, though, including ones you can find at any pharmacy     Medication Management during today's office visit: Meds ordered this encounter  Medications   nitroGLYCERIN (NITRODUR - DOSED IN MG/24 HR) 0.2 mg/hr patch    Sig: Apply 1/4 patch to affected area as directed by MD and change every 24 hours.    Dispense:  10 patch    Refill:  3   Medications Discontinued During This Encounter  Medication Reason   mupirocin ointment (BACTROBAN) 2 % Completed Course    Orders placed today for conditions managed today: No orders of the defined types were placed in this encounter.   Follow-up if needed: No follow-ups on file.  Dragon [employer] One speech-to-text software was used for transcription in this dictation.  Possible transcriptional errors can occur using Animal nutritionist.   Signed,  Christina Galea. Misty Foutz, MD   Outpatient Encounter Medications as of 11/27/2021  Medication Sig   amLODipine (NORVASC) 10 MG tablet Take 1 tablet (10 mg total) by mouth daily. For blood pressure.   hydrochlorothiazide (HYDRODIURIL) 25 MG tablet TAKE 1 TABLET BY MOUTH EVERY DAY FOR BLOOD PRESSURE   metoprolol succinate (TOPROL-XL) 50 MG 24 hr tablet Take 1 tablet (50 mg total) by mouth daily. For blood pressure.   nitroGLYCERIN (NITRODUR - DOSED IN MG/24 HR) 0.2 mg/hr patch Apply 1/4 patch to affected area as directed by MD and change every 24 hours.   omeprazole (PRILOSEC) 40 MG capsule TAKE 1 CAPSULE (40 MG TOTAL) BY MOUTH DAILY. FOR HEARTBURN   [DISCONTINUED] montelukast (SINGULAIR) 10 MG tablet Take 1 tablet (10 mg total) by mouth at bedtime.   [DISCONTINUED] mupirocin ointment (BACTROBAN) 2 % Apply 1 application. topically 2 (two) times daily.   No facility-administered encounter medications on file as of 11/27/2021.

## 2021-11-27 NOTE — Telephone Encounter (Signed)
Unable to speak with pt; left v/m requesting pt to call [facility] [phone] and speak with front office about appt. Pt already has appt with Dr Patsy Lager 11/27/21 at 3:40. Sending note to Lupita Leash CMA and lsc support.

## 2022-01-25 ENCOUNTER — Other Ambulatory Visit: Payer: Self-pay | Admitting: Primary Care

## 2022-01-25 DIAGNOSIS — I10 Essential (primary) hypertension: Secondary | ICD-10-CM

## 2022-01-25 NOTE — Telephone Encounter (Signed)
Patient is due for CPE/follow up in November 2023, this will be required prior to any further refills.  Please schedule.   

## 2022-01-26 NOTE — Telephone Encounter (Signed)
LVM for patient to call and schedule

## 2022-01-27 NOTE — Telephone Encounter (Signed)
Left message to return call to our office.  

## 2022-02-05 NOTE — Telephone Encounter (Signed)
Patient has scheduled appointment.  No further action needed at this time.

## 2022-02-23 ENCOUNTER — Other Ambulatory Visit: Payer: Self-pay | Admitting: Primary Care

## 2022-02-23 DIAGNOSIS — J3089 Other allergic rhinitis: Secondary | ICD-10-CM

## 2022-02-24 ENCOUNTER — Other Ambulatory Visit: Payer: Self-pay | Admitting: Family Medicine

## 2022-02-24 NOTE — Telephone Encounter (Signed)
Last office visit 11/27/21 for right achilles tendonitis.  Last refilled 11/27/21 for #10 with 3 refills.  CPE with PCP on 03/18/22.

## 2022-03-18 ENCOUNTER — Encounter: Payer: Self-pay | Admitting: Primary Care

## 2022-03-18 ENCOUNTER — Ambulatory Visit (INDEPENDENT_AMBULATORY_CARE_PROVIDER_SITE_OTHER): Payer: Commercial Managed Care - PPO | Admitting: Primary Care

## 2022-03-18 VITALS — BP 142/84 | HR 97 | Temp 99.4°F | Ht 62.0 in | Wt 147.0 lb

## 2022-03-18 DIAGNOSIS — J3089 Other allergic rhinitis: Secondary | ICD-10-CM

## 2022-03-18 DIAGNOSIS — I10 Essential (primary) hypertension: Secondary | ICD-10-CM | POA: Diagnosis not present

## 2022-03-18 DIAGNOSIS — Z1231 Encounter for screening mammogram for malignant neoplasm of breast: Secondary | ICD-10-CM

## 2022-03-18 DIAGNOSIS — Z23 Encounter for immunization: Secondary | ICD-10-CM

## 2022-03-18 DIAGNOSIS — E785 Hyperlipidemia, unspecified: Secondary | ICD-10-CM | POA: Diagnosis not present

## 2022-03-18 DIAGNOSIS — K219 Gastro-esophageal reflux disease without esophagitis: Secondary | ICD-10-CM | POA: Diagnosis not present

## 2022-03-18 DIAGNOSIS — Z0000 Encounter for general adult medical examination without abnormal findings: Secondary | ICD-10-CM

## 2022-03-18 LAB — LIPID PANEL
Cholesterol: 226 mg/dL — ABNORMAL HIGH (ref 0–200)
HDL: 101.5 mg/dL (ref >39.00)
LDL Cholesterol: 102 mg/dL — ABNORMAL HIGH (ref 0–99)
NonHDL: 124.03
Total CHOL/HDL Ratio: 2
Triglycerides: 109 mg/dL (ref 0.0–149.0)
VLDL: 21.8 mg/dL (ref 0.0–40.0)

## 2022-03-18 LAB — COMPREHENSIVE METABOLIC PANEL
ALT: 20 U/L (ref 0–35)
AST: 38 U/L — ABNORMAL HIGH (ref 0–37)
Albumin: 4.4 g/dL (ref 3.5–5.2)
Alkaline Phosphatase: 64 U/L (ref 39–117)
BUN: 14 mg/dL (ref 6–23)
CO2: 32 mEq/L (ref 19–32)
Calcium: 10 mg/dL (ref 8.4–10.5)
Chloride: 98 mEq/L (ref 96–112)
Creatinine, Ser: 0.54 mg/dL (ref 0.40–1.20)
GFR: 102.18 mL/min (ref >60.00)
Glucose, Bld: 136 mg/dL — ABNORMAL HIGH (ref 70–99)
Potassium: 3.9 mEq/L (ref 3.5–5.1)
Sodium: 137 mEq/L (ref 135–145)
Total Bilirubin: 0.7 mg/dL (ref 0.2–1.2)
Total Protein: 7.3 g/dL (ref 6.0–8.3)

## 2022-03-18 MED ORDER — OMEPRAZOLE 20 MG PO CPDR: 20.0000 mg | DELAYED_RELEASE_CAPSULE | Freq: Every day | ORAL | 0 refills | Status: DC

## 2022-03-18 NOTE — Assessment & Plan Note (Addendum)
Above goal today, has also not taken BP medications today.  Continue HCTZ 25 mg, amlodipine 10 mg daily, metoprolol succinate 50 mg daily. CMP pending.  Recommended she monitor.

## 2022-03-18 NOTE — Progress Notes (Signed)
Subjective:    Patient ID: Christina Bell, female    DOB: 11/27/1965, 56 y.o.   MRN: [id number]  HPI  Christina Bell is a very pleasant 56 y.o. female who presents today for complete physical and follow up of chronic conditions.  Immunizations: -Tetanus: 2020 -Influenza: Due today, declines  -Shingles: Completed 1 dose of Shingrix, overdue for second dose  Diet: Fair diet.  Exercise: No regular exercise.  Eye exam: Completed years ago  Dental exam: Completes semi-annually   Pap Smear: Completed in 2021 Mammogram: Completed in 2020, never completed last year  Colonoscopy: Completed in 2015, due 2025 per patient Lung Cancer Screening: Never completed, declined last year. Declines today  BP Readings from Last 3 Encounters:  03/18/22 (!) 142/84  11/27/21 136/70  09/23/21 134/62   She has not taken her BP medication today.      Review of Systems  Constitutional:  Negative for unexpected weight change.  HENT:  Negative for rhinorrhea.   Respiratory:  Negative for cough and shortness of breath.   Cardiovascular:  Negative for chest pain.  Gastrointestinal:  Negative for constipation and diarrhea.  Genitourinary:  Negative for difficulty urinating.  Musculoskeletal:  Negative for arthralgias and myalgias.  Skin:  Negative for rash.  Allergic/Immunologic: Negative for environmental allergies.  Neurological:  Negative for dizziness and headaches.  Psychiatric/Behavioral:  The patient is not nervous/anxious.          Past Medical History:  Diagnosis Date   Acute cystitis without hematuria 09/23/2021   Acute neck pain 11/12/2017   Barrett esophagus    COVID-19 virus infection 12/18/2020   GERD (gastroesophageal reflux disease)    H. pylori infection    Heart murmur    Hypertension    Sore in nose 08/01/2021    Social History   Socioeconomic History   Marital status: Married    Spouse name: Not on file   Number of children: Not on file   Years of education: Not  on file   Highest education level: Not on file  Occupational History   Not on file  Tobacco Use   Smoking status: Every Day    Packs/day: 0.50    Years: 25.00    Total pack years: 12.50    Types: Cigarettes   Smokeless tobacco: Never  Substance and Sexual Activity   Alcohol use: Yes    Alcohol/week: 2.0 standard drinks of alcohol    Types: 2 Standard drinks or equivalent per week   Drug use: Not on file   Sexual activity: Not on file  Other Topics Concern   Not on file  Social History Narrative   Married.   3 children.   Worked as a Runner, broadcasting/film/video.    Enjoys waking, bicycling, driving.   Social Determinants of Health   Financial Resource Strain: Not on file  Food Insecurity: Not on file  Transportation Needs: Not on file  Physical Activity: Not on file  Stress: Not on file  Social Connections: Not on file  Intimate Partner Violence: Not on file    Past Surgical History:  Procedure Laterality Date   BREAST BIOPSY Right    duct removed- neg   BREAST BIOPSY Left 05/02/2019   [name] bx/ribbon clip   BREAST BIOPSY Right 05/02/2019   [name] bx/ coil clip   CESAREAN SECTION     CHOLECYSTECTOMY     ECTOPIC PREGNANCY SURGERY      Family History  Problem Relation Age of Onset   Hypertension Mother  Lung cancer Maternal Grandmother    Breast cancer Neg Hx     Allergies  Allergen Reactions   Morphine And Related Hives    Current Outpatient Medications on File Prior to Visit  Medication Sig Dispense Refill   amLODipine (NORVASC) 10 MG tablet Take 1 tablet (10 mg total) by mouth daily. For blood pressure. 90 tablet 3   hydrochlorothiazide (HYDRODIURIL) 25 MG tablet TAKE 1 TABLET BY MOUTH EVERY DAY FOR BLOOD PRESSURE 90 tablet 3   metoprolol succinate (TOPROL-XL) 50 MG 24 hr tablet TAKE 1 TABLET (50 MG TOTAL) BY MOUTH DAILY. FOR BLOOD PRESSURE. 90 tablet 0   montelukast (SINGULAIR) 10 MG tablet Take 1 tablet (10 mg total) by mouth at bedtime. For allergies. Office visit required  for further refills. 90 tablet 0   nitroGLYCERIN (NITRODUR - DOSED IN MG/24 HR) 0.2 mg/hr patch APPLY 1/4 PATCH TO AFFECTED AREA AS DIRECTED BY MD AND CHANGE EVERY 24 HOURS. (Patient not taking: Reported on 03/18/2022) 30 patch 3   No current facility-administered medications on file prior to visit.    BP (!) 142/84   Pulse 97   Temp 99.4 F (37.4 C) (Temporal)   Ht 5\' 2"  (1.575 m)   Wt 147 lb (66.7 kg)   SpO2 99%   BMI 26.89 kg/m  Objective:   Physical Exam HENT:     Right Ear: Tympanic membrane and ear canal normal.     Left Ear: Tympanic membrane and ear canal normal.     Nose: Nose normal.  Eyes:     Conjunctiva/sclera: Conjunctivae normal.     Pupils: Pupils are equal, round, and reactive to light.  Neck:     Thyroid: No thyromegaly.  Cardiovascular:     Rate and Rhythm: Normal rate and regular rhythm.     Heart sounds: No murmur heard. Pulmonary:     Effort: Pulmonary effort is normal.     Breath sounds: Normal breath sounds. No rales.  Abdominal:     General: Bowel sounds are normal.     Palpations: Abdomen is soft.     Tenderness: There is no abdominal tenderness.  Musculoskeletal:        General: Normal range of motion.     Cervical back: Neck supple.  Lymphadenopathy:     Cervical: No cervical adenopathy.  Skin:    General: Skin is warm and dry.     Findings: No rash.  Neurological:     Mental Status: She is alert and oriented to person, place, and time.     Cranial Nerves: No cranial nerve deficit.     Deep Tendon Reflexes: Reflexes are normal and symmetric.  Psychiatric:        Mood and Affect: Mood normal.           Assessment & Plan:   Problem List Items Addressed This Visit       Cardiovascular and Mediastinum   Essential hypertension    Above goal today, has also not taken BP medications today.  Continue HCTZ 25 mg, amlodipine 10 mg daily, metoprolol succinate 50 mg daily. CMP pending.      Relevant Orders   Comprehensive metabolic  panel     Respiratory   Non-seasonal allergic rhinitis due to fungal spores    Uncontrolled.   Continue Singulair 10 mg HS, Flonase. Has failed numerous OTC options.  Referral placed for allergist.       Relevant Orders   Ambulatory referral to Allergy  Digestive   Gastroesophageal reflux disease - Primary    Controlled.  Reduce omeprazole to 20 mg daily with goal of eventual weaning. Consider transition to famotidine.  She will update      Relevant Medications   omeprazole (PRILOSEC) 20 MG capsule     Other   Hyperlipidemia    Repeat lipid panel pending.      Relevant Orders   Lipid panel   Preventative health care    Overdue for second Shingrix vaccine, provided today. Declines influenza vaccine. Mammogram overdue, she agrees to complete, orders placed. Colonoscopy UTD, due 2025 per patient. Declines lung cancer screening.  Discussed the importance of a healthy diet and regular exercise in order for weight loss, and to reduce the risk of further co-morbidity.  Exam stable. Labs pending.  Follow up in 1 year for repeat physical.       Other Visit Diagnoses     Encounter for screening mammogram for malignant neoplasm of breast       Relevant Orders   MM 3D SCREEN BREAST BILATERAL          Pleas Koch, NP

## 2022-03-18 NOTE — Patient Instructions (Signed)
Stop by the lab prior to leaving today. I will notify you of your results once received.   You will be contacted regarding your referral to the allergist.  Please let us know if you have not been contacted within two weeks.   We reduced your dose of omeprazole to 20 mg daily. Please update me in a few weeks.  [facility] to schedule your mammogram.   It was a pleasure to see you today!  Preventive Care 85-57 Years Old, Female Preventive care refers to lifestyle choices and visits with your health care provider that can promote health and wellness. Preventive care visits are also called wellness exams. What can I expect for my preventive care visit? Counseling Your health care provider may ask you questions about your: Medical history, including: Past medical problems. Family medical history. Pregnancy history. Current health, including: Menstrual cycle. Method of birth control. Emotional well-being. Home life and relationship well-being. Sexual activity and sexual health. Lifestyle, including: Alcohol, nicotine or tobacco, and drug use. Access to firearms. Diet, exercise, and sleep habits. Work and work Statistician. Sunscreen use. Safety issues such as seatbelt and bike helmet use. Physical exam Your health care provider will check your: Height and weight. These may be used to calculate your BMI (body mass index). BMI is a measurement that tells if you are at a healthy weight. Waist circumference. This measures the distance around your waistline. This measurement also tells if you are at a healthy weight and may help predict your risk of certain diseases, such as type 2 diabetes and high blood pressure. Heart rate and blood pressure. Body temperature. Skin for abnormal spots. What immunizations do I need?  Vaccines are usually given at various ages, according to a schedule. Your health care provider will recommend vaccines for you based on your age, medical  history, and lifestyle or other factors, such as travel or where you work. What tests do I need? Screening Your health care provider may recommend screening tests for certain conditions. This may include: Lipid and cholesterol levels. Diabetes screening. This is done by checking your blood sugar (glucose) after you have not eaten for a while (fasting). Pelvic exam and Pap test. Hepatitis B test. Hepatitis C test. HIV (human immunodeficiency virus) test. STI (sexually transmitted infection) testing, if you are at risk. Lung cancer screening. Colorectal cancer screening. Mammogram. Talk with your health care provider about when you should start having regular mammograms. This may depend on whether you have a family history of breast cancer. BRCA-related cancer screening. This may be done if you have a family history of breast, ovarian, tubal, or peritoneal cancers. Bone density scan. This is done to screen for osteoporosis. Talk with your health care provider about your test results, treatment options, and if necessary, the need for more tests. Follow these instructions at home: Eating and drinking  Eat a diet that includes fresh fruits and vegetables, whole grains, lean protein, and low-fat dairy products. Take vitamin and mineral supplements as recommended by your health care provider. Do not drink alcohol if: Your health care provider tells you not to drink. You are pregnant, may be pregnant, or are planning to become pregnant. If you drink alcohol: Limit how much you have to 0-1 drink a day. Know how much alcohol is in your drink. In the [place] one drink equals one 12 oz bottle of beer (355 mL), one 5 oz glass of wine (148 mL), or one 1 oz glass of hard liquor (44  mL). Lifestyle Brush your teeth every morning and night with fluoride toothpaste. Floss one time each day. Exercise for at least 30 minutes 5 or more days each week. Do not use any products that contain nicotine or tobacco.  These products include cigarettes, chewing tobacco, and vaping devices, such as e-cigarettes. If you need help quitting, ask your health care provider. Do not use drugs. If you are sexually active, practice safe sex. Use a condom or other form of protection to prevent STIs. If you do not wish to become pregnant, use a form of birth control. If you plan to become pregnant, see your health care provider for a prepregnancy visit. Take aspirin only as told by your health care provider. Make sure that you understand how much to take and what form to take. Work with your health care provider to find out whether it is safe and beneficial for you to take aspirin daily. Find healthy ways to manage stress, such as: Meditation, yoga, or listening to music. Journaling. Talking to a trusted person. Spending time with friends and family. Minimize exposure to UV radiation to reduce your risk of skin cancer. Safety Always wear your seat belt while driving or riding in a vehicle. Do not drive: If you have been drinking alcohol. Do not ride with someone who has been drinking. When you are tired or distracted. While texting. If you have been using any mind-altering substances or drugs. Wear a helmet and other protective equipment during sports activities. If you have firearms in your house, make sure you follow all gun safety procedures. Seek help if you have been physically or sexually abused. What's next? Visit your health care provider once a year for an annual wellness visit. Ask your health care provider how often you should have your eyes and teeth checked. Stay up to date on all vaccines. This information is not intended to replace advice given to you by your health care provider. Make sure you discuss any questions you have with your health care provider. Document Revised: 10/30/2020 Document Reviewed: 10/30/2020 Elsevier Patient Education  [place].

## 2022-03-18 NOTE — Addendum Note (Signed)
Addended by: Pat Kocher on: 03/18/2022 10:21 AM   Modules accepted: Orders

## 2022-03-18 NOTE — Assessment & Plan Note (Signed)
Overdue for second Shingrix vaccine, provided today. Declines influenza vaccine. Mammogram overdue, she agrees to complete, orders placed. Colonoscopy UTD, due 2025 per patient. Declines lung cancer screening.  Discussed the importance of a healthy diet and regular exercise in order for weight loss, and to reduce the risk of further co-morbidity.  Exam stable. Labs pending.  Follow up in 1 year for repeat physical.

## 2022-03-18 NOTE — Assessment & Plan Note (Signed)
Repeat lipid panel pending. 

## 2022-03-18 NOTE — Assessment & Plan Note (Signed)
Uncontrolled.   Continue Singulair 10 mg HS, Flonase. Has failed numerous OTC options.  Referral placed for allergist.

## 2022-03-18 NOTE — Assessment & Plan Note (Signed)
Controlled.  Reduce omeprazole to 20 mg daily with goal of eventual weaning. Consider transition to famotidine.  She will update

## 2022-05-04 ENCOUNTER — Other Ambulatory Visit: Payer: Self-pay | Admitting: Primary Care

## 2022-05-04 DIAGNOSIS — I10 Essential (primary) hypertension: Secondary | ICD-10-CM

## 2022-05-26 ENCOUNTER — Other Ambulatory Visit: Payer: Self-pay | Admitting: Primary Care

## 2022-05-26 DIAGNOSIS — J3089 Other allergic rhinitis: Secondary | ICD-10-CM

## 2022-06-25 ENCOUNTER — Other Ambulatory Visit: Payer: Self-pay | Admitting: Primary Care

## 2022-06-25 DIAGNOSIS — K219 Gastro-esophageal reflux disease without esophagitis: Secondary | ICD-10-CM

## 2022-07-17 ENCOUNTER — Other Ambulatory Visit: Payer: Self-pay | Admitting: Primary Care

## 2022-07-17 DIAGNOSIS — K219 Gastro-esophageal reflux disease without esophagitis: Secondary | ICD-10-CM

## 2022-07-17 DIAGNOSIS — I10 Essential (primary) hypertension: Secondary | ICD-10-CM

## 2022-10-21 ENCOUNTER — Ambulatory Visit
Admission: RE | Admit: 2022-10-21 | Discharge: 2022-10-21 | Disposition: A | Discharge status: Home / Self Care | Payer: Commercial Managed Care - PPO | Source: Ambulatory Visit | Attending: Primary Care | Admitting: Primary Care

## 2022-10-21 DIAGNOSIS — Z1231 Encounter for screening mammogram for malignant neoplasm of breast: Secondary | ICD-10-CM

## 2022-11-05 ENCOUNTER — Encounter: Payer: Self-pay | Admitting: Primary Care

## 2022-11-05 ENCOUNTER — Ambulatory Visit: Payer: Commercial Managed Care - PPO | Admitting: Primary Care

## 2022-11-05 VITALS — BP 142/70 | HR 88 | Temp 98.2°F | Ht 62.0 in | Wt 147.0 lb

## 2022-11-05 DIAGNOSIS — R300 Dysuria: Secondary | ICD-10-CM

## 2022-11-05 LAB — POC URINALSYSI DIPSTICK (AUTOMATED)
Bilirubin, UA: POSITIVE
Blood, UA: POSITIVE
Glucose, UA: NEGATIVE
Ketones, UA: NEGATIVE
Nitrite, UA: POSITIVE
Protein, UA: POSITIVE — AB
Spec Grav, UA: 1.015 (ref 1.010–1.025)
Urobilinogen, UA: 1 E.U./dL
pH, UA: 6 (ref 5.0–8.0)

## 2022-11-05 MED ORDER — SULFAMETHOXAZOLE-TRIMETHOPRIM 800-160 MG PO TABS
1.0000 | ORAL_TABLET | Freq: Two times a day (BID) | ORAL | 0 refills | Status: DC
Start: 2022-11-05 — End: 2023-02-01

## 2022-11-05 NOTE — Patient Instructions (Signed)
Start Bactrim DS (sulfamethoxazole/trimethoprim) tablets for urinary tract infection. Take 1 tablet by mouth twice daily for 3 days.  Increase water intake.   It was a pleasure to see you today!  

## 2022-11-05 NOTE — Assessment & Plan Note (Signed)
Symptoms suggestive of cystitis. UA today with 3+ leuks, positive nitrites, 1+ blood. Culture sent and pending.  Start Bactrim DS (sulfamethoxazole/trimethoprim) tablets for urinary tract infection. Take 1 tablet by mouth twice daily for 3 days.  Increase water intake. Follow-up as needed.

## 2022-11-05 NOTE — Progress Notes (Signed)
Subjective:    Patient ID: Christina Bell, female    DOB: [birth year], 57 y.o.   MRN: [id number]  Back Pain Associated symptoms include dysuria. Pertinent negatives include no abdominal pain or fever.    Christina Bell is a very pleasant 57 y.o. female with a history of recurrent cystitis, hypertension who presents today to discuss dysuria.  Symptoms onset yesterday with dysuria, urinary frequency, and hematuria. She denies pelvic pain, fevers, abdominal pain, vaginal itching, vaginal itching.  She's taken AZO yesterday and today without much improvement.   Her symptoms today feel like her typical UTI. Her last diagnosed UTI was in May of 2023, E coli positive without resistance.    Review of Systems  Constitutional:  Negative for fever.  Gastrointestinal:  Negative for abdominal pain.  Genitourinary:  Positive for dysuria, frequency and hematuria. Negative for flank pain and vaginal discharge.         Past Medical History:  Diagnosis Date   Acute cystitis without hematuria 09/23/2021   Acute neck pain 11/12/2017   Barrett esophagus    COVID-19 virus infection 12/18/2020   GERD (gastroesophageal reflux disease)    H. pylori infection    Heart murmur    Hypertension    Sore in nose 08/01/2021    Social History   Socioeconomic History   Marital status: Married    Spouse name: Not on file   Number of children: Not on file   Years of education: Not on file   Highest education level: Not on file  Occupational History   Not on file  Tobacco Use   Smoking status: Every Day    Packs/day: 0.50    Years: 25.00    Additional pack years: 0.00    Total pack years: 12.50    Types: Cigarettes   Smokeless tobacco: Never  Substance and Sexual Activity   Alcohol use: Yes    Alcohol/week: 2.0 standard drinks of alcohol    Types: 2 Standard drinks or equivalent per week   Drug use: Not on file   Sexual activity: Not on file  Other Topics Concern   Not on file  Social History  Narrative   Married.   3 children.   Worked as a Runner, broadcasting/film/video.    Enjoys waking, bicycling, driving.   Social Determinants of Health   Financial Resource Strain: Not on file  Food Insecurity: Not on file  Transportation Needs: Not on file  Physical Activity: Not on file  Stress: Not on file  Social Connections: Not on file  Intimate Partner Violence: Not on file    Past Surgical History:  Procedure Laterality Date   BREAST BIOPSY Right    duct removed- neg   BREAST BIOPSY Left 05/02/2019   [name] bx/ribbon clip   BREAST BIOPSY Right 05/02/2019   [name] bx/ coil clip   CESAREAN SECTION     CHOLECYSTECTOMY     ECTOPIC PREGNANCY SURGERY      Family History  Problem Relation Age of Onset   Hypertension Mother    Lung cancer Maternal Grandmother    Breast cancer Neg Hx     Allergies  Allergen Reactions   Morphine And Codeine Hives    Current Outpatient Medications on File Prior to Visit  Medication Sig Dispense Refill   amLODipine (NORVASC) 10 MG tablet TAKE 1 TABLET BY MOUTH EVERY DAY FOR BLOOD PRESSURE 90 tablet 2   hydrochlorothiazide (HYDRODIURIL) 25 MG tablet TAKE 1 TABLET BY MOUTH EVERY DAY FOR BLOOD PRESSURE  90 tablet 2   metoprolol succinate (TOPROL-XL) 50 MG 24 hr tablet TAKE 1 TABLET (50 MG TOTAL) BY MOUTH DAILY. FOR BLOOD PRESSURE. 90 tablet 1   omeprazole (PRILOSEC) 20 MG capsule TAKE 1 CAPSULE (20 MG TOTAL) BY MOUTH DAILY. FOR HEARTBURN 90 capsule 2   montelukast (SINGULAIR) 10 MG tablet Take 1 tablet (10 mg total) by mouth at bedtime. For allergies (Patient not taking: Reported on 11/05/2022) 90 tablet 2   nitroGLYCERIN (NITRODUR - DOSED IN MG/24 HR) 0.2 mg/hr patch APPLY 1/4 PATCH TO AFFECTED AREA AS DIRECTED BY MD AND CHANGE EVERY 24 HOURS. (Patient not taking: Reported on 03/18/2022) 30 patch 3   No current facility-administered medications on file prior to visit.    BP (!) 142/70   Pulse 88   Temp 98.2 F (36.8 C) (Temporal)   Ht 5\' 2"  (1.575 m)   Wt 147 lb  (66.7 kg)   SpO2 97%   BMI 26.89 kg/m  Objective:   Physical Exam Cardiovascular:     Rate and Rhythm: Normal rate.  Pulmonary:     Effort: Pulmonary effort is normal.  Abdominal:     Tenderness: There is no right CVA tenderness or left CVA tenderness.  Musculoskeletal:     Cervical back: Neck supple.  Skin:    General: Skin is warm and dry.           Assessment & Plan:  Dysuria Assessment & Plan: Symptoms suggestive of cystitis. UA today with 3+ leuks, positive nitrites, 1+ blood. Culture sent and pending.  Start Bactrim DS (sulfamethoxazole/trimethoprim) tablets for urinary tract infection. Take 1 tablet by mouth twice daily for 3 days.  Increase water intake. Follow-up as needed.  Orders: -     POCT Urinalysis Dipstick (Automated) -     Urine Culture -     Sulfamethoxazole-Trimethoprim; Take 1 tablet by mouth 2 (two) times daily. For urinary tract infection.  Dispense: 6 tablet; Refill: 0        Doreene Nest, NP

## 2022-11-07 ENCOUNTER — Other Ambulatory Visit: Payer: Self-pay | Admitting: Primary Care

## 2022-11-07 DIAGNOSIS — I10 Essential (primary) hypertension: Secondary | ICD-10-CM

## 2022-11-07 LAB — URINE CULTURE
MICRO NUMBER:: 15107276
SPECIMEN QUALITY:: ADEQUATE

## 2022-12-18 ENCOUNTER — Telehealth: Payer: Self-pay | Admitting: Primary Care

## 2022-12-18 NOTE — Telephone Encounter (Signed)
Called and spoke with patients husband, advised in order for [name] to do a Covid test at the office the patient would need an appointment with a provider. Advised we do not do walk in Covid testing. Patient was verbally upset that they cannot get a test done without an appointment, he stated the patient is not sick enough to need treatment/ a visit, they just need to get a test done in order to confirm or deny she has Covid for her employer. Advised patients husband test kits are found OTC at local pharmacies for at home testing. He stated he checked with [facility] in [place] and they were charging $25 for an at home Covid test, patients husband stated he feels that is ridiculous when he pays for health insurance and has a healthcare provider in [place]. Pt husband said he would get the [facility]id test form [facility] and pay for it.  Patients husband is requesting a callback from management who deals with this policy. Advised him it would be next week before anyone called due to management being out of the office.

## 2022-12-18 NOTE — Telephone Encounter (Signed)
Pt son Mellody Dance called in requesting  call back . Stated pt need a covid test done advise pt need appointment . Please advise # [phone]

## 2022-12-19 ENCOUNTER — Other Ambulatory Visit: Payer: Self-pay | Admitting: Primary Care

## 2022-12-19 DIAGNOSIS — J3089 Other allergic rhinitis: Secondary | ICD-10-CM

## 2022-12-22 ENCOUNTER — Telehealth: Payer: Self-pay | Admitting: Primary Care

## 2022-12-22 NOTE — Telephone Encounter (Signed)
Called patient she will send message as requested. Will call if any questions.

## 2022-12-22 NOTE — Telephone Encounter (Signed)
Patient's husband called the office to make an appointment for patient to return to work. Patient had covid and is needing a letter to return to work, requested an appointment for today. Advised [employer] did not have any openings today and offered an appointment for tomorrow. Patient's husband asked if there was any way [employer] could write a letter to allow patient to return to work? Please advise patient at [phone].

## 2022-12-22 NOTE — Telephone Encounter (Signed)
Please have patient log into her MyChart portal explaining that she had COVID-19 infection and is needing a return to work note.  Have her also include when her symptoms started, what her symptoms consisted of, and how she is feeling now.

## 2023-01-27 ENCOUNTER — Ambulatory Visit: Payer: Commercial Managed Care - PPO | Admitting: Primary Care

## 2023-02-01 ENCOUNTER — Ambulatory Visit: Payer: Commercial Managed Care - PPO | Admitting: Primary Care

## 2023-02-01 ENCOUNTER — Encounter: Payer: Self-pay | Admitting: Primary Care

## 2023-02-01 VITALS — BP 160/70 | HR 105 | Temp 97.3°F | Ht 62.0 in | Wt 149.0 lb

## 2023-02-01 DIAGNOSIS — R229 Localized swelling, mass and lump, unspecified: Secondary | ICD-10-CM | POA: Diagnosis not present

## 2023-02-01 DIAGNOSIS — I10 Essential (primary) hypertension: Secondary | ICD-10-CM

## 2023-02-01 HISTORY — DX: Localized swelling, mass and lump, unspecified: R22.9

## 2023-02-01 MED ORDER — METOPROLOL SUCCINATE ER 50 MG PO TB24: 50.0000 mg | ORAL_TABLET | Freq: Every day | ORAL | 0 refills | Status: DC

## 2023-02-01 NOTE — Patient Instructions (Addendum)
You will either be contacted via phone regarding your ultrasound, or you may receive a letter on your MyChart portal from our referral team with instructions for scheduling an appointment. Please let us know if you have not been contacted by anyone within two weeks.  Resume your metoprolol succinate blood pressure pill.  We will see you in November as scheduled.  It was a pleasure to see you today!

## 2023-02-01 NOTE — Assessment & Plan Note (Signed)
Unclear etiology.  Will obtain soft tissue ultrasound for further evaluation. Consider plastic surgery referral for excitation.

## 2023-02-01 NOTE — Progress Notes (Signed)
Subjective:    Patient ID: Christina Bell, female    DOB: Dec 14, 1965, 57 y.o.   MRN: [id number]  HPI  Christina Bell is a very pleasant 57 y.o. female with a history of hypertension, GERD, allergic rhinitis, recurrent UTI, chronic neck pain who presents today to discuss skin mass.  She is also needing a refill of her metoprolol succinate.  The mass is located to the top of the left external ear for which she first noticed about 6 months ago. Since then the mass has grown slowly. About 4 days ago she put a pin into the mass in an effort to to drain it. She only saw bright red blood and was unable to squeeze anything out.  The mass is slightly tender from her recent attempt to drain it, otherwise denies is not tender or painful.  She is a smoker. She denies cervical lymphadenopathy, postnasal drip, oral sores, trouble swallowing, recent illness.  She has been out of her metoprolol succinate for a few weeks and is needing a refill today.   Review of Systems  Constitutional:  Negative for fever.  HENT:  Negative for ear pain, postnasal drip and sore throat.   Skin:  Negative for color change.       Skin mass         Past Medical History:  Diagnosis Date   Acute cystitis without hematuria 09/23/2021   Acute neck pain 11/12/2017   Barrett esophagus    COVID-19 virus infection 12/18/2020   GERD (gastroesophageal reflux disease)    H. pylori infection    Heart murmur    Hypertension    Sore in nose 08/01/2021    Social History   Socioeconomic History   Marital status: Married    Spouse name: Not on file   Number of children: Not on file   Years of education: Not on file   Highest education level: Not on file  Occupational History   Not on file  Tobacco Use   Smoking status: Every Day    Current packs/day: 0.50    Average packs/day: 0.5 packs/day for 25.0 years (12.5 ttl pk-yrs)    Types: Cigarettes   Smokeless tobacco: Never  Substance and Sexual Activity   Alcohol use: Yes     Alcohol/week: 2.0 standard drinks of alcohol    Types: 2 Standard drinks or equivalent per week   Drug use: Not on file   Sexual activity: Not on file  Other Topics Concern   Not on file  Social History Narrative   Married.   3 children.   Worked as a Runner, broadcasting/film/video.    Enjoys waking, bicycling, driving.   Social Determinants of Health   Financial Resource Strain: Not on file  Food Insecurity: Not on file  Transportation Needs: Not on file  Physical Activity: Not on file  Stress: Not on file  Social Connections: Not on file  Intimate Partner Violence: Not on file    Past Surgical History:  Procedure Laterality Date   BREAST BIOPSY Right    duct removed- neg   BREAST BIOPSY Left 05/02/2019   [name] bx/ribbon clip   BREAST BIOPSY Right 05/02/2019   [name] bx/ coil clip   CESAREAN SECTION     CHOLECYSTECTOMY     ECTOPIC PREGNANCY SURGERY      Family History  Problem Relation Age of Onset   Hypertension Mother    Lung cancer Maternal Grandmother    Breast cancer Neg Hx  Allergies  Allergen Reactions   Morphine And Codeine Hives    Current Outpatient Medications on File Prior to Visit  Medication Sig Dispense Refill   amLODipine (NORVASC) 10 MG tablet TAKE 1 TABLET BY MOUTH EVERY DAY FOR BLOOD PRESSURE 90 tablet 2   hydrochlorothiazide (HYDRODIURIL) 25 MG tablet TAKE 1 TABLET BY MOUTH EVERY DAY FOR BLOOD PRESSURE 90 tablet 2   omeprazole (PRILOSEC) 20 MG capsule TAKE 1 CAPSULE (20 MG TOTAL) BY MOUTH DAILY. FOR HEARTBURN 90 capsule 2   No current facility-administered medications on file prior to visit.    BP (!) 162/84   Pulse (!) 105   Temp (!) 97.3 F (36.3 C) (Temporal)   Ht 5\' 2"  (1.575 m)   Wt 149 lb (67.6 kg)   SpO2 97%   BMI 27.25 kg/m  Objective:   Physical Exam Lymphadenopathy:     Head:     Right side of head: No submental, submandibular, preauricular or posterior auricular adenopathy.     Left side of head: No submental, submandibular, preauricular  or posterior auricular adenopathy.     Cervical: No cervical adenopathy.  Skin:    General: Skin is warm and dry.     Comments: 1 cm rounded, raised, soft, mobile, flesh colored mass to top of left ear. Mild erythema and scaling from where she attempted to drain.  No other adenopathy.           Assessment & Plan:  Localized skin mass, lump, or swelling Assessment & Plan: Unclear etiology.  Will obtain soft tissue ultrasound for further evaluation. Consider plastic surgery referral for excitation.  Orders: -     US SOFT TISSUE HEAD & NECK (NON-THYROID); Future  Essential hypertension -     Metoprolol Succinate ER; Take 1 tablet (50 mg total) by mouth daily. For blood pressure.  Dispense: 90 tablet; Refill: 0        Doreene Nest, NP

## 2023-02-04 ENCOUNTER — Ambulatory Visit
Admission: RE | Admit: 2023-02-04 | Discharge: 2023-02-04 | Disposition: A | Discharge status: Home / Self Care | Payer: Commercial Managed Care - PPO | Source: Ambulatory Visit | Attending: Primary Care | Admitting: Primary Care

## 2023-02-04 DIAGNOSIS — R229 Localized swelling, mass and lump, unspecified: Secondary | ICD-10-CM

## 2023-02-05 ENCOUNTER — Telehealth: Payer: Self-pay | Admitting: Primary Care

## 2023-02-05 ENCOUNTER — Other Ambulatory Visit: Payer: Self-pay | Admitting: Primary Care

## 2023-02-05 DIAGNOSIS — K219 Gastro-esophageal reflux disease without esophagitis: Secondary | ICD-10-CM

## 2023-02-05 DIAGNOSIS — I10 Essential (primary) hypertension: Secondary | ICD-10-CM

## 2023-02-05 NOTE — Telephone Encounter (Signed)
Left voicemail advising patients [name] from yesterday has not been read yet, and once we receive the final result we will call with Christina Bell message.

## 2023-02-05 NOTE — Telephone Encounter (Signed)
Patient husband called in to follow up on results. He can be reached at [phone]. Thank you!

## 2023-03-23 ENCOUNTER — Encounter: Payer: Self-pay | Admitting: Primary Care

## 2023-04-30 ENCOUNTER — Other Ambulatory Visit: Payer: Self-pay | Admitting: Primary Care

## 2023-04-30 DIAGNOSIS — I10 Essential (primary) hypertension: Secondary | ICD-10-CM

## 2023-05-01 ENCOUNTER — Other Ambulatory Visit: Payer: Self-pay | Admitting: Primary Care

## 2023-05-01 DIAGNOSIS — I10 Essential (primary) hypertension: Secondary | ICD-10-CM

## 2023-05-01 NOTE — Telephone Encounter (Signed)
Patient is due for follow up with labs, this will be required prior to any further refills.  Please schedule, thank you!

## 2023-05-01 NOTE — Telephone Encounter (Signed)
Patient is due for CPE/follow up, this will be required prior to any further refills.  Please schedule, thank you!   

## 2023-05-02 ENCOUNTER — Other Ambulatory Visit: Payer: Self-pay | Admitting: Primary Care

## 2023-05-02 DIAGNOSIS — I10 Essential (primary) hypertension: Secondary | ICD-10-CM

## 2023-05-03 NOTE — Telephone Encounter (Signed)
Spoke with patient and she stated that she doesn't have insurance right now to be seen. Her husband is still looking for work and she will schedule an appointment then. Thank you!

## 2023-05-03 NOTE — Telephone Encounter (Signed)
LVM for patient to c/b and schedule.  

## 2023-05-03 NOTE — Telephone Encounter (Signed)
Patient is due for CPE/follow up, this will be required prior to any further refills.  Please schedule, thank you!   

## 2023-05-04 NOTE — Telephone Encounter (Signed)
Patient doesn't have insurance at the moment.

## 2023-06-01 ENCOUNTER — Other Ambulatory Visit: Payer: Self-pay | Admitting: Primary Care

## 2023-06-01 DIAGNOSIS — I10 Essential (primary) hypertension: Secondary | ICD-10-CM

## 2023-06-30 ENCOUNTER — Other Ambulatory Visit: Payer: Self-pay | Admitting: Primary Care

## 2023-06-30 DIAGNOSIS — I10 Essential (primary) hypertension: Secondary | ICD-10-CM

## 2023-06-30 NOTE — Telephone Encounter (Signed)
Please call patient:  She is overdue for general follow up and labs given the medications she is prescribed. Can we get her scheduled?

## 2023-06-30 NOTE — Telephone Encounter (Signed)
Called pt. No answer and Unable to leave voicemail.

## 2023-07-02 NOTE — Telephone Encounter (Signed)
LVM Wednesday and today for patient to cb and schedule.

## 2023-07-05 NOTE — Telephone Encounter (Signed)
Unable to reach patient. Left voicemail to return call to our office.   3rd attempt, mailing letter

## 2023-07-06 ENCOUNTER — Other Ambulatory Visit: Payer: Self-pay | Admitting: Primary Care

## 2023-07-06 DIAGNOSIS — I10 Essential (primary) hypertension: Secondary | ICD-10-CM

## 2023-07-12 ENCOUNTER — Ambulatory Visit: Payer: Self-pay | Admitting: Primary Care

## 2023-07-14 ENCOUNTER — Other Ambulatory Visit (HOSPITAL_COMMUNITY)
Admission: RE | Admit: 2023-07-14 | Discharge: 2023-07-14 | Disposition: A | Discharge status: Home / Self Care | Payer: 59 | Source: Ambulatory Visit | Attending: Primary Care | Admitting: Primary Care

## 2023-07-14 ENCOUNTER — Ambulatory Visit (INDEPENDENT_AMBULATORY_CARE_PROVIDER_SITE_OTHER): Payer: 59 | Admitting: Primary Care

## 2023-07-14 ENCOUNTER — Encounter: Payer: Self-pay | Admitting: Primary Care

## 2023-07-14 VITALS — BP 130/84 | HR 100 | Temp 97.2°F | Ht 62.0 in | Wt 142.0 lb

## 2023-07-14 DIAGNOSIS — K219 Gastro-esophageal reflux disease without esophagitis: Secondary | ICD-10-CM

## 2023-07-14 DIAGNOSIS — Z124 Encounter for screening for malignant neoplasm of cervix: Secondary | ICD-10-CM | POA: Diagnosis present

## 2023-07-14 DIAGNOSIS — I10 Essential (primary) hypertension: Secondary | ICD-10-CM

## 2023-07-14 DIAGNOSIS — Z0000 Encounter for general adult medical examination without abnormal findings: Secondary | ICD-10-CM

## 2023-07-14 DIAGNOSIS — Z1211 Encounter for screening for malignant neoplasm of colon: Secondary | ICD-10-CM

## 2023-07-14 DIAGNOSIS — E785 Hyperlipidemia, unspecified: Secondary | ICD-10-CM

## 2023-07-14 LAB — LIPID PANEL
Cholesterol: 231 mg/dL — ABNORMAL HIGH (ref 0–200)
HDL: 113.9 mg/dL (ref >39.00)
LDL Cholesterol: 103 mg/dL — ABNORMAL HIGH (ref 0–99)
NonHDL: 117.28
Total CHOL/HDL Ratio: 2
Triglycerides: 72 mg/dL (ref 0.0–149.0)
VLDL: 14.4 mg/dL (ref 0.0–40.0)

## 2023-07-14 LAB — COMPREHENSIVE METABOLIC PANEL
ALT: 43 U/L — ABNORMAL HIGH (ref 0–35)
AST: 86 U/L — ABNORMAL HIGH (ref 0–37)
Albumin: 4.5 g/dL (ref 3.5–5.2)
Alkaline Phosphatase: 74 U/L (ref 39–117)
BUN: 6 mg/dL (ref 6–23)
CO2: 22 meq/L (ref 19–32)
Calcium: 9.2 mg/dL (ref 8.4–10.5)
Chloride: 97 meq/L (ref 96–112)
Creatinine, Ser: 0.52 mg/dL (ref 0.40–1.20)
GFR: 102.16 mL/min (ref >60.00)
Glucose, Bld: 99 mg/dL (ref 70–99)
Potassium: 3.1 meq/L — ABNORMAL LOW (ref 3.5–5.1)
Sodium: 137 meq/L (ref 135–145)
Total Bilirubin: 0.9 mg/dL (ref 0.2–1.2)
Total Protein: 7.8 g/dL (ref 6.0–8.3)

## 2023-07-14 LAB — CBC
HCT: 45.9 % (ref 36.0–46.0)
Hemoglobin: 15.6 g/dL — ABNORMAL HIGH (ref 12.0–15.0)
MCHC: 34 g/dL (ref 30.0–36.0)
MCV: 96.2 fL (ref 78.0–100.0)
Platelets: 233 10*3/uL (ref 150.0–400.0)
RBC: 4.77 Mil/uL (ref 3.87–5.11)
RDW: 12.8 % (ref 11.5–15.5)
WBC: 5.6 10*3/uL (ref 4.0–10.5)

## 2023-07-14 LAB — HEMOGLOBIN A1C: Hgb A1c MFr Bld: 5.3 % (ref 4.6–6.5)

## 2023-07-14 MED ORDER — AMLODIPINE BESYLATE 10 MG PO TABS: 10.0000 mg | ORAL_TABLET | Freq: Every day | ORAL | 3 refills | Status: DC

## 2023-07-14 MED ORDER — HYDROCHLOROTHIAZIDE 25 MG PO TABS: 25.0000 mg | ORAL_TABLET | Freq: Every day | ORAL | 3 refills | Status: DC

## 2023-07-14 NOTE — Patient Instructions (Addendum)
 Stop by the lab prior to leaving today. I will notify you of your results once received.   Complete the Cologuard kit for colon cancer screening.  It was a pleasure to see you today!

## 2023-07-14 NOTE — Assessment & Plan Note (Signed)
Repeat lipid panel pending.  Discussed the importance of a healthy diet and regular exercise in order for weight loss, and to reduce the risk of further co-morbidity.  

## 2023-07-14 NOTE — Assessment & Plan Note (Addendum)
 Immunizations UTD. Declines influenza vaccine. Pap smear due, completed today Mammogram UTD, due June 2025. Discussed with patient. Declines lung cancer screening program referral. Colonoscopy due, she declines but opts for Cologuard, orders placed.  Discussed the importance of a healthy diet and regular exercise in order for weight loss, and to reduce the risk of further co-morbidity.  Exam stable. Labs pending.  Follow up in 1 year for repeat physical.

## 2023-07-14 NOTE — Assessment & Plan Note (Signed)
Controlled.   Continue omeprazole 20 mg daily. 

## 2023-07-14 NOTE — Progress Notes (Signed)
 Subjective:    Patient ID: Christina Bell, female    DOB: 08-21-1965, 58 y.o.   MRN: [id number]  HPI  Christina Bell is a very pleasant 58 y.o. female who presents today for complete physical and follow up of chronic conditions.  Immunizations: -Tetanus: Completed in 2020 -Influenza: Declines influenza vaccine  -Shingles: Completed Shingrix series  Diet: Fair diet.  Exercise: No regular exercise.  Eye exam: Completed several years ago  Dental exam: Completes semi-annually    Pap Smear: Completed in November 2021 Mammogram: Completed in June 2024  Colonoscopy: Completed in 2015, declines colonoscopy  Lung Cancer Screening: Never completed, declines   BP Readings from Last 3 Encounters:  07/14/23 130/84  02/01/23 (!) 160/70  11/05/22 (!) 142/70      Review of Systems  Constitutional:  Negative for unexpected weight change.  HENT:  Negative for rhinorrhea.   Respiratory:  Negative for cough and shortness of breath.   Cardiovascular:  Negative for chest pain.  Gastrointestinal:  Negative for constipation and diarrhea.  Genitourinary:  Negative for difficulty urinating.  Musculoskeletal:  Negative for arthralgias and myalgias.  Skin:  Negative for rash.  Allergic/Immunologic: Negative for environmental allergies.  Neurological:  Negative for dizziness, numbness and headaches.  Psychiatric/Behavioral:  The patient is not nervous/anxious.          Past Medical History:  Diagnosis Date   Acute cystitis without hematuria 09/23/2021   Acute neck pain 11/12/2017   Barrett esophagus    COVID-19 virus infection 12/18/2020   GERD (gastroesophageal reflux disease)    H. pylori infection    Heart murmur    Hypertension    Localized skin mass, lump, or swelling 02/01/2023   Sore in nose 08/01/2021    Social History   Socioeconomic History   Marital status: Married    Spouse name: Not on file   Number of children: Not on file   Years of education: Not on file    Highest education level: Not on file  Occupational History   Not on file  Tobacco Use   Smoking status: Every Day    Current packs/day: 0.50    Average packs/day: 0.5 packs/day for 25.0 years (12.5 ttl pk-yrs)    Types: Cigarettes   Smokeless tobacco: Never  Substance and Sexual Activity   Alcohol use: Yes    Alcohol/week: 2.0 standard drinks of alcohol    Types: 2 Standard drinks or equivalent per week   Drug use: Not on file   Sexual activity: Not on file  Other Topics Concern   Not on file  Social History Narrative   Married.   3 children.   Worked as a Runner, broadcasting/film/video.    Enjoys waking, bicycling, driving.   Social Drivers of Corporate investment banker Strain: Not on file  Food Insecurity: Not on file  Transportation Needs: Not on file  Physical Activity: Not on file  Stress: Not on file  Social Connections: Not on file  Intimate Partner Violence: Not on file    Past Surgical History:  Procedure Laterality Date   BREAST BIOPSY Right    duct removed- neg   BREAST BIOPSY Left 05/02/2019   [name] bx/ribbon clip   BREAST BIOPSY Right 05/02/2019   [name] bx/ coil clip   CESAREAN SECTION     CHOLECYSTECTOMY     ECTOPIC PREGNANCY SURGERY      Family History  Problem Relation Age of Onset   Hypertension Mother    Lung cancer Maternal  Grandmother    Breast cancer Neg Hx     Allergies  Allergen Reactions   Morphine And Codeine Hives    Current Outpatient Medications on File Prior to Visit  Medication Sig Dispense Refill   omeprazole (PRILOSEC) 20 MG capsule TAKE 1 CAPSULE (20 MG TOTAL) BY MOUTH DAILY. FOR HEARTBURN 90 capsule 2   No current facility-administered medications on file prior to visit.    BP 130/84   Pulse 100   Temp (!) 97.2 F (36.2 C) (Temporal)   Ht 5\' 2"  (1.575 m)   Wt 142 lb (64.4 kg)   SpO2 96%   BMI 25.97 kg/m  Objective:   Physical Exam Exam conducted with a chaperone present.  HENT:     Right Ear: Tympanic membrane and ear canal normal.      Left Ear: Tympanic membrane and ear canal normal.  Eyes:     Pupils: Pupils are equal, round, and reactive to light.  Cardiovascular:     Rate and Rhythm: Normal rate and regular rhythm.  Pulmonary:     Effort: Pulmonary effort is normal.     Breath sounds: Normal breath sounds.  Abdominal:     General: Bowel sounds are normal.     Palpations: Abdomen is soft.     Tenderness: There is no abdominal tenderness.  Genitourinary:    Labia:        Right: No rash, tenderness or lesion.        Left: No rash, tenderness or lesion.      Vagina: Normal.     Cervix: Normal.     Uterus: Normal.      Adnexa: Right adnexa normal and left adnexa normal.  Musculoskeletal:        General: Normal range of motion.     Cervical back: Neck supple.  Skin:    General: Skin is warm and dry.  Neurological:     Mental Status: She is alert and oriented to person, place, and time.     Cranial Nerves: No cranial nerve deficit.     Deep Tendon Reflexes:     Reflex Scores:      Patellar reflexes are 2+ on the right side and 2+ on the left side. Psychiatric:        Mood and Affect: Mood normal.           Assessment & Plan:  Preventative health care Assessment & Plan: Immunizations UTD. Declines influenza vaccine. Pap smear due, completed today Mammogram UTD, due June 2025. Discussed with patient. Declines lung cancer screening program referral. Colonoscopy due, she declines but opts for Cologuard, orders placed.  Discussed the importance of a healthy diet and regular exercise in order for weight loss, and to reduce the risk of further co-morbidity.  Exam stable. Labs pending.  Follow up in 1 year for repeat physical.    Essential hypertension Assessment & Plan: Controlled.  Continue hydrochlorothiazide 25 mg daily. Continue amlodipine 10 mg daily.  CMP pending  Has not taken metoprolol succinate since October 2024, remain off.   Orders: -     Hemoglobin A1c -      Comprehensive metabolic panel -     CBC -     Lipid panel -     hydroCHLOROthiazide; Take 1 tablet (25 mg total) by mouth daily. for blood pressure.  Dispense: 90 tablet; Refill: 3 -     amLODIPine Besylate; Take 1 tablet (10 mg total) by mouth daily. for blood pressure.  Dispense: 90 tablet; Refill: 3  Gastroesophageal reflux disease, unspecified whether esophagitis present Assessment & Plan: Controlled.  Continue omeprazole 20 mg daily.   Hyperlipidemia, unspecified hyperlipidemia type Assessment & Plan: Repeat lipid panel pending.  Discussed the importance of a healthy diet and regular exercise in order for weight loss, and to reduce the risk of further co-morbidity.   Orders: -     Hemoglobin A1c -     Comprehensive metabolic panel -     CBC -     Lipid panel  Screening for cervical cancer -     Cytology - PAP  Screening for colon cancer -     Cologuard        Doreene Nest, NP

## 2023-07-14 NOTE — Assessment & Plan Note (Signed)
 Controlled.  Continue hydrochlorothiazide 25 mg daily. Continue amlodipine 10 mg daily.  CMP pending  Has not taken metoprolol succinate since October 2024, remain off.

## 2023-07-15 ENCOUNTER — Other Ambulatory Visit: Payer: Self-pay | Admitting: Primary Care

## 2023-07-15 DIAGNOSIS — E876 Hypokalemia: Secondary | ICD-10-CM

## 2023-07-15 LAB — CYTOLOGY - PAP
Adequacy: ABSENT
Comment: NEGATIVE
Diagnosis: NEGATIVE
High risk HPV: NEGATIVE

## 2023-07-15 MED ORDER — POTASSIUM CHLORIDE CRYS ER 20 MEQ PO TBCR: 20.0000 meq | EXTENDED_RELEASE_TABLET | Freq: Two times a day (BID) | ORAL | 0 refills | Status: AC

## 2024-06-12 ENCOUNTER — Other Ambulatory Visit: Payer: Self-pay | Admitting: Primary Care

## 2024-06-12 DIAGNOSIS — I10 Essential (primary) hypertension: Secondary | ICD-10-CM

## 2024-06-13 NOTE — Telephone Encounter (Signed)
 Patient is due for CPE/follow up in early March, this will be required prior to any further refills.  Please schedule, thank you!
